# Patient Record
Sex: Female | Born: 1995 | Race: Black or African American | Hispanic: No | Marital: Single | Smoking: Current some day smoker
Health system: Southern US, Community
[De-identification: ages and names within clinical notes are randomized; demographics above are authoritative.]

## PROBLEM LIST (undated history)

## (undated) ENCOUNTER — Ambulatory Visit (HOSPITAL_COMMUNITY): Admission: EM | Payer: BLUE CROSS/BLUE SHIELD | Source: Home / Self Care

## (undated) DIAGNOSIS — N83209 Unspecified ovarian cyst, unspecified side: Secondary | ICD-10-CM

## (undated) DIAGNOSIS — R1013 Epigastric pain: Secondary | ICD-10-CM

---
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
## 2015-04-25 HISTORY — PX: UPPER GI ENDOSCOPY: SHX6162

## 2015-05-18 ENCOUNTER — Encounter (HOSPITAL_COMMUNITY): Payer: Self-pay | Admitting: Emergency Medicine

## 2015-05-18 ENCOUNTER — Emergency Department (HOSPITAL_COMMUNITY)
Admission: EM | Admit: 2015-05-18 | Discharge: 2015-05-18 | Discharge status: Against Medical Advice | Payer: BLUE CROSS/BLUE SHIELD | Attending: Emergency Medicine | Admitting: Emergency Medicine

## 2015-05-18 DIAGNOSIS — F17200 Nicotine dependence, unspecified, uncomplicated: Secondary | ICD-10-CM | POA: Diagnosis not present

## 2015-05-18 DIAGNOSIS — R197 Diarrhea, unspecified: Secondary | ICD-10-CM | POA: Insufficient documentation

## 2015-05-18 DIAGNOSIS — R109 Unspecified abdominal pain: Secondary | ICD-10-CM | POA: Diagnosis not present

## 2015-05-18 DIAGNOSIS — R112 Nausea with vomiting, unspecified: Secondary | ICD-10-CM | POA: Insufficient documentation

## 2015-05-18 DIAGNOSIS — Z3202 Encounter for pregnancy test, result negative: Secondary | ICD-10-CM | POA: Diagnosis not present

## 2015-05-18 LAB — URINALYSIS, ROUTINE W REFLEX MICROSCOPIC
BILIRUBIN URINE: NEGATIVE
GLUCOSE, UA: NEGATIVE mg/dL
KETONES UR: NEGATIVE mg/dL
Nitrite: POSITIVE — AB
PROTEIN: 100 mg/dL — AB
Specific Gravity, Urine: 1.014 (ref 1.005–1.030)
pH: 6.5 (ref 5.0–8.0)

## 2015-05-18 LAB — URINE MICROSCOPIC-ADD ON

## 2015-05-18 LAB — COMPREHENSIVE METABOLIC PANEL
ALBUMIN: 4.2 g/dL (ref 3.5–5.0)
ALT: 13 U/L — AB (ref 14–54)
ANION GAP: 12 (ref 5–15)
AST: 19 U/L (ref 15–41)
Alkaline Phosphatase: 66 U/L (ref 38–126)
BUN: 8 mg/dL (ref 6–20)
CALCIUM: 9.8 mg/dL (ref 8.9–10.3)
CHLORIDE: 104 mmol/L (ref 101–111)
CO2: 24 mmol/L (ref 22–32)
Creatinine, Ser: 0.8 mg/dL (ref 0.44–1.00)
GFR calc non Af Amer: >60 mL/min (ref >60)
GLUCOSE: 109 mg/dL — AB (ref 65–99)
POTASSIUM: 3.7 mmol/L (ref 3.5–5.1)
SODIUM: 140 mmol/L (ref 135–145)
Total Bilirubin: 0.8 mg/dL (ref 0.3–1.2)
Total Protein: 7.6 g/dL (ref 6.5–8.1)

## 2015-05-18 LAB — CBC
HCT: 31.6 % — ABNORMAL LOW (ref 36.0–46.0)
HEMOGLOBIN: 10.6 g/dL — AB (ref 12.0–15.0)
MCH: 26.7 pg (ref 26.0–34.0)
MCHC: 33.5 g/dL (ref 30.0–36.0)
MCV: 79.6 fL (ref 78.0–100.0)
Platelets: 298 10*3/uL (ref 150–400)
RBC: 3.97 MIL/uL (ref 3.87–5.11)
RDW: 16.6 % — ABNORMAL HIGH (ref 11.5–15.5)
WBC: 8.5 10*3/uL (ref 4.0–10.5)

## 2015-05-18 LAB — LIPASE, BLOOD: Lipase: 34 U/L (ref 11–51)

## 2015-05-18 LAB — POC URINE PREG, ED: Preg Test, Ur: NEGATIVE

## 2015-05-18 NOTE — ED Notes (Signed)
Pt.left without being seen by the doctor.pt.she has being waiting to long .stated she will return later.

## 2015-05-18 NOTE — ED Notes (Signed)
Pt. reports pain across her abdomen with nausea , vomitting and diarrhea onset this evening , denies fever or chills.

## 2015-05-19 ENCOUNTER — Encounter (HOSPITAL_COMMUNITY): Payer: Self-pay

## 2015-05-19 ENCOUNTER — Emergency Department (HOSPITAL_COMMUNITY)
Admission: EM | Admit: 2015-05-19 | Discharge: 2015-05-19 | Disposition: A | Discharge status: Home / Self Care | Payer: BLUE CROSS/BLUE SHIELD | Attending: Emergency Medicine | Admitting: Emergency Medicine

## 2015-05-19 DIAGNOSIS — F17200 Nicotine dependence, unspecified, uncomplicated: Secondary | ICD-10-CM | POA: Diagnosis not present

## 2015-05-19 DIAGNOSIS — R1032 Left lower quadrant pain: Secondary | ICD-10-CM | POA: Diagnosis present

## 2015-05-19 DIAGNOSIS — Z3202 Encounter for pregnancy test, result negative: Secondary | ICD-10-CM | POA: Diagnosis not present

## 2015-05-19 DIAGNOSIS — N390 Urinary tract infection, site not specified: Secondary | ICD-10-CM | POA: Diagnosis not present

## 2015-05-19 LAB — I-STAT BETA HCG BLOOD, ED (MC, WL, AP ONLY): I-stat hCG, quantitative: <5 m[IU]/mL (ref <5)

## 2015-05-19 LAB — CBC
HEMATOCRIT: 35.1 % — AB (ref 36.0–46.0)
Hemoglobin: 11.6 g/dL — ABNORMAL LOW (ref 12.0–15.0)
MCH: 26.8 pg (ref 26.0–34.0)
MCHC: 33 g/dL (ref 30.0–36.0)
MCV: 81.1 fL (ref 78.0–100.0)
PLATELETS: 356 10*3/uL (ref 150–400)
RBC: 4.33 MIL/uL (ref 3.87–5.11)
RDW: 16.5 % — AB (ref 11.5–15.5)
WBC: 9.3 10*3/uL (ref 4.0–10.5)

## 2015-05-19 LAB — COMPREHENSIVE METABOLIC PANEL
ALBUMIN: 5 g/dL (ref 3.5–5.0)
ALT: 14 U/L (ref 14–54)
AST: 20 U/L (ref 15–41)
Alkaline Phosphatase: 69 U/L (ref 38–126)
Anion gap: 11 (ref 5–15)
BILIRUBIN TOTAL: 0.8 mg/dL (ref 0.3–1.2)
BUN: 12 mg/dL (ref 6–20)
CHLORIDE: 104 mmol/L (ref 101–111)
CO2: 25 mmol/L (ref 22–32)
CREATININE: 0.81 mg/dL (ref 0.44–1.00)
Calcium: 10 mg/dL (ref 8.9–10.3)
GFR calc Af Amer: >60 mL/min (ref >60)
GLUCOSE: 116 mg/dL — AB (ref 65–99)
POTASSIUM: 3.4 mmol/L — AB (ref 3.5–5.1)
Sodium: 140 mmol/L (ref 135–145)
Total Protein: 8.9 g/dL — ABNORMAL HIGH (ref 6.5–8.1)

## 2015-05-19 LAB — LIPASE, BLOOD: LIPASE: 37 U/L (ref 11–51)

## 2015-05-19 MED ORDER — CEPHALEXIN 500 MG PO CAPS: 500.0000 mg | ORAL_CAPSULE | Freq: Four times a day (QID) | ORAL | Status: DC

## 2015-05-19 MED ORDER — ONDANSETRON 4 MG PO TBDP
4.0000 mg | ORAL_TABLET | Freq: Once | ORAL | Status: AC
  Administered 2015-05-19: 4 mg via ORAL
  Filled 2015-05-19: qty 1

## 2015-05-19 MED ORDER — HYDROCODONE-ACETAMINOPHEN 5-325 MG PO TABS: 1.0000 | ORAL_TABLET | Freq: Four times a day (QID) | ORAL | Status: DC | PRN

## 2015-05-19 MED ORDER — KETOROLAC TROMETHAMINE 30 MG/ML IJ SOLN
30.0000 mg | Freq: Once | INTRAMUSCULAR | Status: AC
  Administered 2015-05-19: 30 mg via INTRAMUSCULAR
  Filled 2015-05-19: qty 1

## 2015-05-19 NOTE — ED Notes (Signed)
She c/o low abd. Pain x 2-3 days.  She states she was seen at [facility] today and was dx with uti and given an "antibiotic shot".  She states she is here d/t "I'm just hurting too bad" [sic].  She is in no distress.

## 2015-05-19 NOTE — Discharge Instructions (Signed)
Please read and follow all provided instructions.  Your diagnoses today include:  1. UTI (lower urinary tract infection)    Tests performed today include:  Urine test - suggests that you have an infection in your bladder  Vital signs. See below for your results today.   Medications prescribed:   Keflex- Take as Prescribed   Norco- Take as Prescriebd  Home care instructions:  Follow any educational materials contained in this packet.  Follow-up instructions: Please follow-up with your primary care provider in 48-72 hours if symptoms are not resolved for further evaluation of your symptoms.  Return instructions:   Please return to the Emergency Department if you experience worsening symptoms.   Return with fever, worsening pain, persistent vomiting, worsening pain in your back.   Please return if you have any other emergent concerns.  Additional Information:  Your vital signs today were: BP 120/82 mmHg   Pulse 68   Temp(Src) 98.2 F (36.8 C) (Oral)   Resp 16   SpO2 98%   LMP 04/02/2015 If your blood pressure (BP) was elevated above 135/85 this visit, please have this repeated by your doctor within one month. --------------

## 2015-05-19 NOTE — ED Provider Notes (Signed)
CSN: [id number]     Arrival date & time 05/19/15  1251 History   First MD Initiated Contact with Patient 05/19/15 1530     Chief Complaint  Patient presents with  . Abdominal Pain   (Consider location/radiation/quality/duration/timing/severity/associated sxs/prior Treatment) HPI 20 y.o. female presents to the Emergency Department today complaining of lower abdominal pain for the past 2 days. She states that the pain is a 10/10 in the LLQ and RLQ. Sharp and crampy sensation. Associated N/V with no diarrhea. No fevers. Reports decrease PO intake. [facility]. They did a urine and pelvic exam and found that she had a UTI. She was given a shot of antibiotics and discharged home. Pt still continuing to have symptoms. Does not endorse any dysuria, btu does endorse vaginal discharge. No CP/SOB. No headache. No other symptoms noted. No bleeding. LMP was Dec 12th.    History reviewed. No pertinent past medical history. No past surgical history on file. No family history on file. Social History  Substance Use Topics  . Smoking status: Current Some Day Smoker  . Smokeless tobacco: None  . Alcohol Use: Yes   OB History    No data available     Review of Systems ROS reviewed and all are negative for acute change except as noted in the HPI.  Allergies  Review of patient's allergies indicates no known allergies.  Home Medications   Prior to Admission medications   Medication Sig Start Date End Date Taking? Authorizing Provider  naproxen sodium (ANAPROX) 220 MG tablet Take 440 mg by mouth daily as needed (pain).   Yes Historical Provider, MD   BP 120/82 mmHg  Pulse 68  Temp(Src) 98.2 F (36.8 C) (Oral)  Resp 16  SpO2 98%  LMP 04/02/2015   Physical Exam  Constitutional: She is oriented to person, place, and time. She appears well-developed and well-nourished.  HENT:  Head: Normocephalic and atraumatic.  Eyes: EOM are normal.   Neck: Normal range of motion. Neck supple.  Cardiovascular: Normal rate, regular rhythm and normal heart sounds.   Pulmonary/Chest: Effort normal and breath sounds normal.  Abdominal: Soft. Normal appearance and bowel sounds are normal. She exhibits no distension and no mass. There is tenderness in the right lower quadrant and left lower quadrant. There is CVA tenderness. There is no rigidity, no guarding, no tenderness at McBurney's point and negative Murphy's sign.  Musculoskeletal: Normal range of motion.  Neurological: She is alert and oriented to person, place, and time.  Skin: Skin is warm and dry.  Psychiatric: She has a normal mood and affect. Her behavior is normal. Thought content normal.  Nursing note and vitals reviewed.  ED Course  Procedures (including critical care time) Labs Review Labs Reviewed  COMPREHENSIVE METABOLIC PANEL - Abnormal; Notable for the following:    Potassium 3.4 (*)    Glucose, Bld 116 (*)    Total Protein 8.9 (*)    All other components within normal limits  CBC - Abnormal; Notable for the following:    Hemoglobin 11.6 (*)    HCT 35.1 (*)    RDW 16.5 (*)    All other components within normal limits  LIPASE, BLOOD  I-STAT BETA HCG BLOOD, ED ([facility], [facility], [facility] ONLY)   Imaging Review No results found. I have personally reviewed and evaluated these images and lab results as part of my medical decision-making.   EKG Interpretation None  MDM  I have reviewed relevant laboratory values. I have reviewed the relevant previous healthcare records. I obtained HPI from historian. Patient discussed with supervising physician  ED Course:  Assessment: 19y female presents with lower abdominal pain for the past 2 days. Seen by [facility] today, but came to ED for pain control. I spoke with the Physician who took care of the patient today at the [facility]. States that the Urine came back with Pos Bac/Leuk/Nitrites. Treated with 1g Rocephin IM. GC  collected and waiting results. Pelvic exam was unremarkable on his exam. Impression was that it may be the onset of menses that the patient endorses happens. Low suspicion for appendicitis and ovarian torsion based on exam and hx. Given Rx of Keflex as well as analgesia. Will have her follow up with infirmary for results of previous visit and further treatment.  Patient is in no acute distress. Vital Signs are stable. Patient is able to ambulate. Patient able to tolerate PO.      Disposition/Plan:  DC Home Additional Verbal discharge instructions given and discussed with patient.  Pt Instructed to f/u with PCP in the next 48 hours for evaluation and treatment of symptoms. Return precautions given Pt acknowledges and agrees with plan   Supervising Physician Vanetta Mulders, MD   Final diagnoses:  UTI (lower urinary tract infection)       Audry Pili, PA-C 05/19/15 1736  Vanetta Mulders, MD 05/22/15 [phone]

## 2015-07-18 ENCOUNTER — Emergency Department: Admit: 2015-07-19 | Payer: BLUE CROSS/BLUE SHIELD | Primary: Pediatrics

## 2015-07-18 DIAGNOSIS — N83209 Unspecified ovarian cyst, unspecified side: Secondary | ICD-10-CM

## 2015-07-18 NOTE — ED Notes (Signed)
Pt states she is sexually active, and does not always use protection. States she is "not sure" if she has vaginal discharge.

## 2015-07-18 NOTE — ED Notes (Signed)
Assisted with pelvic done by NP. Pt tolerated well.

## 2015-07-18 NOTE — ED Triage Notes (Signed)
"  I'm having pains in my lower stomach since yesterday."  Intermittent pain, pt. Report vomiting x 3 today, denies diarrhea/urinary symptoms.  Ibuprofen 400 mg @ 1200

## 2015-07-18 NOTE — ED Provider Notes (Signed)
HPI Comments: Leah Sanders is a 20 y.o. female  who presents ambulatory with her mother to [facility] peds ED with cc of pelvic pain. Mother states pt had worsening pelvic pain x 2 days. She states pt called her from college (in [facility]) several times c/o worsening pain and crying. No hx of abdominal trauma or falls. Pt also had nausea and vomiting today. No diarrhea. Last normal BM was yesterday.  Mother states similar episode of pelvic pain in January of this year. Saw PCP and [facility] NP and advised no acute findings. Pt states she was evaluated in ED in [place] at that time and was given "a shot and placed on some antibiotics" although pt is not sure why these were administered. Pt states she is not on birth control and had unprotected sex. She denies any atypical vaginal bleeding or discharge. Does no report urinary symptoms. Has taken Motrin WNR of pain and nothing makes pain better or worse at this time. Denies any fevers, reports some chills. No recent sore throat, cough, congestion, rhinorrhea. No abnormal WWE in the past.     PCP: Salli Real, MD    There are no other complaints, changes or physical findings at this time.      The history is provided by the patient and a parent.        History reviewed. No pertinent past medical history.    History reviewed. No pertinent surgical history.      History reviewed. No pertinent family history.    Social History     Social History   ??? Marital status: SINGLE     Spouse name: N/A   ??? Number of children: N/A   ??? Years of education: N/A     Occupational History   ??? Not on file.     Social History Main Topics   ??? Smoking status: Not on file   ??? Smokeless tobacco: Not on file   ??? Alcohol use Not on file   ??? Drug use: Not on file   ??? Sexual activity: Not on file     Other Topics Concern   ??? Not on file     Social History Narrative   ??? No narrative on file         ALLERGIES: Review of patient's allergies indicates no known allergies.    Review of Systems    Constitutional: Negative for activity change, appetite change, chills and fever.   HENT: Negative for congestion, rhinorrhea, sinus pressure, sneezing and sore throat.    Eyes: Negative for pain, discharge and visual disturbance.   Respiratory: Negative for cough and shortness of breath.    Cardiovascular: Negative for chest pain.   Gastrointestinal: Positive for nausea and vomiting. Negative for abdominal pain and diarrhea.   Genitourinary: Positive for pelvic pain. Negative for dysuria, flank pain, frequency and urgency.   Musculoskeletal: Negative for arthralgias, back pain, gait problem, joint swelling, myalgias and neck pain.   Skin: Negative for color change and rash.   Neurological: Negative for dizziness, speech difficulty, weakness, light-headedness, numbness and headaches.   Psychiatric/Behavioral: Negative for agitation, behavioral problems and confusion.   All other systems reviewed and are negative.      Vitals:    07/18/15 2021 07/18/15 2240   BP: 115/61    Pulse: 77 68   Resp: 18 16   Temp: 98.2 ??F (36.8 ??C) 98.6 ??F (37 ??C)   SpO2: 99% 100%   Weight: 63.5 kg (139 lb 15.9  oz)             Physical Exam   Constitutional: She is oriented to person, place, and time. She appears well-developed and well-nourished. She appears distressed (pain distress).   HENT:   Head: Normocephalic and atraumatic.   Right Ear: External ear normal.   Left Ear: External ear normal.   Nose: Nose normal.   Mouth/Throat: Oropharynx is clear and moist. No oropharyngeal exudate.   Eyes: Conjunctivae and EOM are normal. Pupils are equal, round, and reactive to light.   Neck: Normal range of motion. Neck supple.   Cardiovascular: Normal rate, regular rhythm, normal heart sounds and intact distal pulses.    Pulmonary/Chest: Effort normal and breath sounds normal.   Abdominal: Soft. Bowel sounds are normal. She exhibits no distension. There is no tenderness. There is no rebound and no guarding.   Genitourinary:    Genitourinary Comments: GU EXAM:  External genitalia normal.  Pelvic exam: cervix normal, ovaries and uterus normal size and non-tender to palpation, no cervical motion tenderness or adnexal masses. (+) thin/white/nonodorous discharge. No bleeding or foreign body.      Musculoskeletal: Normal range of motion.   Neurological: She is alert and oriented to person, place, and time.   Skin: Skin is warm and dry.   Psychiatric: She has a normal mood and affect. Her behavior is normal. Judgment and thought content normal.   Nursing note and vitals reviewed.       MDM  Number of Diagnoses or Management Options  BV (bacterial vaginosis):   Cyst of ovary, unspecified laterality:   Nausea and vomiting, intractability of vomiting not specified, unspecified vomiting type:   Diagnosis management comments: DDx; STI, BV, ovarian cyst, UTI, pregnancy     20 yo F presents 2/2 c/o pelvic pain worsening over the weekend. R ovarian cyst noted on [place]. (+) BV on pelvic findings. Non-toxic appearance with stable VS. Advised for f/u with OBGYN. Also advised on safe sex/ BC planning. Educated on pain management for home.  N/V could be viral as well as response to pain. TOlerated PO p/t d/c.        Amount and/or Complexity of Data Reviewed  Clinical lab tests: ordered and reviewed  Tests in the radiology section of CPT??: ordered and reviewed  Review and summarize past medical records: yes  Discuss the patient with other providers: yes      ED Course       Procedures    Procedure Note - Pelvic Exam:    10:30 PM  Performed by: Donzetta Kohut, NP  Chaperoned by: Jenny Reichmann RN   Pelvic exam was performed using bimanual and speculum. Further findings noted in physical exam.   The procedure took 1-15 minutes, and pt tolerated well.      LABORATORY TESTS:  Recent Results (from the past 12 hour(s))   URINALYSIS W/ RFLX MICROSCOPIC    Collection Time: 07/18/15  8:25 PM   Result Value Ref Range    Color YELLOW/STRAW      Appearance CLEAR CLEAR       Specific gravity 1.020 1.003 - 1.030      pH (UA) 6.0 5.0 - 8.0      Protein TRACE (A) NEG mg/dL    Glucose NEGATIVE  NEG mg/dL    Ketone >80 (A) NEG mg/dL    Blood TRACE (A) NEG      Urobilinogen 0.2 0.2 - 1.0 EU/dL    Nitrites NEGATIVE  NEG  Leukocyte Esterase NEGATIVE  NEG      WBC 0-4 0 - 4 /hpf    RBC 0-5 0 - 5 /hpf    Epithelial cells FEW FEW /lpf    Bacteria 1+ (A) NEG /hpf    Amorphous Crystals FEW (A) NEG     BILIRUBIN, CONFIRM    Collection Time: 07/18/15  8:25 PM   Result Value Ref Range    Bilirubin UA, confirm NEGATIVE  NEG     CBC WITH AUTOMATED DIFF    Collection Time: 07/18/15  8:25 PM   Result Value Ref Range    WBC 9.3 3.6 - 11.0 K/uL    RBC 4.71 3.80 - 5.20 M/uL    HGB 12.7 11.5 - 16.0 g/dL    HCT 37.5 35.0 - 47.0 %    MCV 79.6 (L) 80.0 - 99.0 FL    MCH 27.0 26.0 - 34.0 PG    MCHC 33.9 30.0 - 36.5 g/dL    RDW 15.3 (H) 11.5 - 14.5 %    PLATELET 304 150 - 400 K/uL    NEUTROPHILS 82 (H) 32 - 75 %    LYMPHOCYTES 12 12 - 49 %    MONOCYTES 6 5 - 13 %    EOSINOPHILS 0 0 - 7 %    BASOPHILS 0 0 - 1 %    ABS. NEUTROPHILS 7.6 1.8 - 8.0 K/UL    ABS. LYMPHOCYTES 1.1 0.8 - 3.5 K/UL    ABS. MONOCYTES 0.6 0.0 - 1.0 K/UL    ABS. EOSINOPHILS 0.0 0.0 - 0.4 K/UL    ABS. BASOPHILS 0.0 0.0 - 0.1 K/UL   METABOLIC PANEL, COMPREHENSIVE    Collection Time: 07/18/15  8:25 PM   Result Value Ref Range    Sodium 136 136 - 145 mmol/L    Potassium 3.9 3.5 - 5.1 mmol/L    Chloride 102 97 - 108 mmol/L    CO2 20 (L) 21 - 32 mmol/L    Anion gap 14 5 - 15 mmol/L    Glucose 81 65 - 100 mg/dL    BUN 12 6 - 20 MG/DL    Creatinine 0.99 0.55 - 1.02 MG/DL    BUN/Creatinine ratio 12 12 - 20      GFR est AA >60 >60 ml/min/1.87m    GFR est non-AA >60 >60 ml/min/1.735m   Calcium 10.6 (H) 8.5 - 10.1 MG/DL    Bilirubin, total 1.6 (H) 0.2 - 1.0 MG/DL    ALT (SGPT) 22 12 - 78 U/L    AST (SGOT) 19 15 - 37 U/L    Alk. phosphatase 83 45 - 117 U/L    Protein, total 9.6 (H) 6.4 - 8.2 g/dL    Albumin 5.5 (H) 3.5 - 5.0 g/dL     Globulin 4.1 (H) 2.0 - 4.0 g/dL    A-G Ratio 1.3 1.1 - 2.2     HCG URINE, QL. - POC    Collection Time: 07/18/15  8:27 PM   Result Value Ref Range    Pregnancy test,urine (POC) NEGATIVE  NEG     KOH, OTHER SOURCES    Collection Time: 07/18/15 10:30 PM   Result Value Ref Range    Special Requests: NO SPECIAL REQUESTS      KOH NO YEAST SEEN     WET PREP    Collection Time: 07/18/15 10:30 PM   Result Value Ref Range    Clue cells CLUE CELLS PRESENT  Wet prep NO TRICHOMONAS SEEN         IMAGING RESULTS:  [place] PELV NON OBS   Final Result      US TRANSVAGINAL   Final Result      EXAM: US pelvis non-OB; US transvaginal non-OB  ??  INDICATION: Lower abdominal pain. Vomiting for 3 days.  ??  COMPARISON: None.  ??  TECHNIQUE: Transabdominal and transvaginal ultrasound of the female pelvis.  ??  FINDINGS: Transabdominal ultrasound:  Urinary bladder: within normal limits.  ??  The uterus and ovaries are not visualized due to bowel gas.  ??  Free fluid: None.  ??  Endovaginal ultrasound:   Uterus: measures 6.1 x 2.9 x 2.3 cm, which is within normal limits. There is no  sonographic myometrial abnormality.  ??  Endometrium: 11 mm in thickness. Homogeneous.  ??  Right ovary: 4.7 x 4.7 x 3.9 cm. Blood flow is present in the right ovary.  Hemorrhagic cyst measures 3.9 x 3.4 x 2.9 cm.  ??  Left ovary: 2.9 x 2.2 x 3.5 cm. Blood flow is present in the left ovary. No  adnexal cyst or mass.  ??  Free fluid: None.  ??  IMPRESSION  IMPRESSION:   A right adnexal hemorrhagic cyst measures 3.9 x 3.4 x 2.9 cm. Normal appearance  of the uterus and left ovary.    MEDICATIONS GIVEN:  Medications   ketorolac (TORADOL) injection 30 mg (30 mg IntraVENous Given 07/18/15 2100)   ondansetron (ZOFRAN) injection 4 mg (4 mg IntraVENous Given 07/18/15 2100)   sodium chloride 0.9 % bolus infusion 1,000 mL (0 mL IntraVENous IV Completed 07/18/15 2212)   HYDROcodone-acetaminophen (NORCO) 5-325 mg per tablet 1 Tab (1 Tab Oral Given 07/18/15 2237)    ibuprofen (MOTRIN) tablet 800 mg (800 mg Oral Given 07/18/15 2237)   metroNIDAZOLE (FLAGYL) tablet 500 mg (500 mg Oral Given 07/18/15 2319)   ondansetron (ZOFRAN ODT) tablet 4 mg (4 mg Oral Given 07/18/15 2319)       IMPRESSION:  1. Nausea and vomiting, intractability of vomiting not specified, unspecified vomiting type    2. Cyst of ovary, unspecified laterality    3. BV (bacterial vaginosis)        PLAN:  1.   Discharge Medication List as of 07/18/2015 11:15 PM      START taking these medications    Details   ondansetron (ZOFRAN ODT) 4 mg disintegrating tablet Take 1 Tab by mouth every eight (8) hours as needed for Nausea., Print, Disp-10 Tab, R-0      metroNIDAZOLE (FLAGYL) 500 mg tablet Take 1 Tab by mouth two (2) times a day for 7 days., Print, Disp-14 Tab, R-0      ibuprofen (MOTRIN) 800 mg tablet Take 1 Tab by mouth every six (6) hours as needed for Pain for up to 7 days., Print, Disp-20 Tab, R-0      HYDROcodone-acetaminophen (NORCO) 5-325 mg per tablet Take 1 Tab by mouth every eight (8) hours as needed for Pain. Max Daily Amount: 3 Tabs., Print, Disp-8 Tab, R-0           2.   Follow-up Information     Follow up With Details Comments [place], NP Schedule an appointment as soon as possible for a visit  [place] [id number]  [phone]      [facility] EMR DEPT Go to As  needed, If symptoms worsen [place]  [phone]        3. Return to ED if worse       Discharge Note:    The patient is ready for discharge. The patient's signs, symptoms, diagnosis, and discharge instruction have been discussed and the patient has conveyed their understanding. The patient is to follow up as recommended or return to the ER should their symptoms worsen. Plan has been discussed and the patient is in agreement.    Donzetta Kohut, NP

## 2015-07-18 NOTE — ED Notes (Signed)
Pt to US.

## 2015-07-18 NOTE — ED Notes (Signed)
Pt discharged home. Pt acting age appropriately, respirations regular and unlabored, cap refill less than two seconds. Skin pink, dry and warm. Lungs clear bilaterally. No further complaints at this time. Pt verbalized understanding of discharge paperwork and has no further questions at this time.    Education provided about continuation of care, follow up care and medication administration. Pt able to provided teach back about discharge instructions.

## 2015-07-19 ENCOUNTER — Inpatient Hospital Stay
Admit: 2015-07-19 | Discharge: 2015-07-19 | Disposition: A | Discharge status: Home / Self Care | Payer: BLUE CROSS/BLUE SHIELD | Attending: Emergency Medicine

## 2015-07-19 LAB — URINALYSIS W/ RFLX MICROSCOPIC
Glucose: NEGATIVE mg/dL
Ketone: >80 mg/dL — AB
Leukocyte Esterase: NEGATIVE
Nitrites: NEGATIVE
Specific gravity: 1.02 (ref 1.003–1.030)
Urobilinogen: 0.2 EU/dL (ref 0.2–1.0)
pH (UA): 6 (ref 5.0–8.0)

## 2015-07-19 LAB — METABOLIC PANEL, COMPREHENSIVE
A-G Ratio: 1.3 (ref 1.1–2.2)
ALT (SGPT): 22 U/L (ref 12–78)
AST (SGOT): 19 U/L (ref 15–37)
Albumin: 5.5 g/dL — ABNORMAL HIGH (ref 3.5–5.0)
Alk. phosphatase: 83 U/L (ref 45–117)
Anion gap: 14 mmol/L (ref 5–15)
BUN/Creatinine ratio: 12 (ref 12–20)
BUN: 12 MG/DL (ref 6–20)
Bilirubin, total: 1.6 MG/DL — ABNORMAL HIGH (ref 0.2–1.0)
CO2: 20 mmol/L — ABNORMAL LOW (ref 21–32)
Calcium: 10.6 MG/DL — ABNORMAL HIGH (ref 8.5–10.1)
Chloride: 102 mmol/L (ref 97–108)
Creatinine: 0.99 MG/DL (ref 0.55–1.02)
GFR est AA: >60 mL/min/{1.73_m2} (ref >60)
GFR est non-AA: >60 mL/min/{1.73_m2} (ref >60)
Globulin: 4.1 g/dL — ABNORMAL HIGH (ref 2.0–4.0)
Glucose: 81 mg/dL (ref 65–100)
Potassium: 3.9 mmol/L (ref 3.5–5.1)
Protein, total: 9.6 g/dL — ABNORMAL HIGH (ref 6.4–8.2)
Sodium: 136 mmol/L (ref 136–145)

## 2015-07-19 LAB — HCG URINE, QL. - POC: Pregnancy test,urine (POC): NEGATIVE

## 2015-07-19 LAB — CBC WITH AUTOMATED DIFF
ABS. BASOPHILS: 0 10*3/uL (ref 0.0–0.1)
ABS. EOSINOPHILS: 0 10*3/uL (ref 0.0–0.4)
ABS. LYMPHOCYTES: 1.1 10*3/uL (ref 0.8–3.5)
ABS. MONOCYTES: 0.6 10*3/uL (ref 0.0–1.0)
ABS. NEUTROPHILS: 7.6 10*3/uL (ref 1.8–8.0)
BASOPHILS: 0 % (ref 0–1)
EOSINOPHILS: 0 % (ref 0–7)
HCT: 37.5 % (ref 35.0–47.0)
HGB: 12.7 g/dL (ref 11.5–16.0)
LYMPHOCYTES: 12 % (ref 12–49)
MCH: 27 PG (ref 26.0–34.0)
MCHC: 33.9 g/dL (ref 30.0–36.5)
MCV: 79.6 FL — ABNORMAL LOW (ref 80.0–99.0)
MONOCYTES: 6 % (ref 5–13)
NEUTROPHILS: 82 % — ABNORMAL HIGH (ref 32–75)
PLATELET: 304 10*3/uL (ref 150–400)
RBC: 4.71 M/uL (ref 3.80–5.20)
RDW: 15.3 % — ABNORMAL HIGH (ref 11.5–14.5)
WBC: 9.3 10*3/uL (ref 3.6–11.0)

## 2015-07-19 LAB — KOH, OTHER SOURCES: KOH: NONE SEEN

## 2015-07-19 LAB — BILIRUBIN, CONFIRM: Bilirubin UA, confirm: NEGATIVE

## 2015-07-19 LAB — WET PREP: Wet prep: NONE SEEN

## 2015-07-19 MED ORDER — ONDANSETRON 4 MG TAB, RAPID DISSOLVE
4 mg | ORAL | Status: AC
Start: 2015-07-19 — End: 2015-07-18
  Administered 2015-07-19: 03:00:00 via ORAL

## 2015-07-19 MED ORDER — IBUPROFEN 800 MG TAB
800 mg | ORAL_TABLET | Freq: Four times a day (QID) | ORAL | 0 refills | Status: AC | PRN
Start: 2015-07-19 — End: 2015-07-25

## 2015-07-19 MED ORDER — KETOROLAC TROMETHAMINE 30 MG/ML INJECTION
30 mg/mL (1 mL) | INTRAMUSCULAR | Status: AC
Start: 2015-07-19 — End: 2015-07-18
  Administered 2015-07-19: 01:00:00 via INTRAVENOUS

## 2015-07-19 MED ORDER — IBUPROFEN 400 MG TAB
400 mg | ORAL | Status: AC
Start: 2015-07-19 — End: 2015-07-18
  Administered 2015-07-19: 03:00:00 via ORAL

## 2015-07-19 MED ORDER — METRONIDAZOLE 250 MG TAB
250 mg | ORAL | Status: AC
Start: 2015-07-19 — End: 2015-07-18
  Administered 2015-07-19: 03:00:00 via ORAL

## 2015-07-19 MED ORDER — ONDANSETRON (PF) 4 MG/2 ML INJECTION
4 mg/2 mL | INTRAMUSCULAR | Status: AC
Start: 2015-07-19 — End: 2015-07-18
  Administered 2015-07-19: 01:00:00 via INTRAVENOUS

## 2015-07-19 MED ORDER — METRONIDAZOLE 500 MG TAB
500 mg | ORAL_TABLET | Freq: Two times a day (BID) | ORAL | 0 refills | Status: AC
Start: 2015-07-19 — End: 2015-07-25

## 2015-07-19 MED ORDER — SODIUM CHLORIDE 0.9% BOLUS IV
0.9 % | Freq: Once | INTRAVENOUS | Status: AC
Start: 2015-07-19 — End: 2015-07-18
  Administered 2015-07-19: 01:00:00 via INTRAVENOUS

## 2015-07-19 MED ORDER — HYDROCODONE-ACETAMINOPHEN 5 MG-325 MG TAB
5-325 mg | ORAL | Status: AC
Start: 2015-07-19 — End: 2015-07-18
  Administered 2015-07-19: 03:00:00 via ORAL

## 2015-07-19 MED ORDER — ONDANSETRON 4 MG TAB, RAPID DISSOLVE
4 mg | ORAL_TABLET | Freq: Three times a day (TID) | ORAL | 0 refills | Status: DC | PRN
Start: 2015-07-19 — End: 2018-05-02

## 2015-07-19 MED ORDER — HYDROCODONE-ACETAMINOPHEN 5 MG-325 MG TAB
5-325 mg | ORAL_TABLET | Freq: Three times a day (TID) | ORAL | 0 refills | Status: DC | PRN
Start: 2015-07-19 — End: 2015-07-22

## 2015-07-19 MED FILL — IBUPROFEN 400 MG TAB: 400 mg | ORAL | Qty: 2

## 2015-07-19 MED FILL — ONDANSETRON (PF) 4 MG/2 ML INJECTION: 4 mg/2 mL | INTRAMUSCULAR | Qty: 2

## 2015-07-19 MED FILL — HYDROCODONE-ACETAMINOPHEN 5 MG-325 MG TAB: 5-325 mg | ORAL | Qty: 1

## 2015-07-19 MED FILL — SODIUM CHLORIDE 0.9 % IV: INTRAVENOUS | Qty: 1000

## 2015-07-19 MED FILL — METRONIDAZOLE 250 MG TAB: 250 mg | ORAL | Qty: 2

## 2015-07-19 MED FILL — KETOROLAC TROMETHAMINE 30 MG/ML INJECTION: 30 mg/mL (1 mL) | INTRAMUSCULAR | Qty: 1

## 2015-07-20 LAB — CHLAMYDIA/GC PCR
Chlamydia amplified: NEGATIVE
N. gonorrhea, amplified: NEGATIVE

## 2015-07-22 ENCOUNTER — Emergency Department: Admit: 2015-07-22 | Payer: BLUE CROSS/BLUE SHIELD | Primary: Pediatrics

## 2015-07-22 ENCOUNTER — Inpatient Hospital Stay
Admit: 2015-07-22 | Discharge: 2015-07-23 | Disposition: A | Discharge status: Home / Self Care | Payer: BLUE CROSS/BLUE SHIELD | Attending: Student in an Organized Health Care Education/Training Program

## 2015-07-22 DIAGNOSIS — N83201 Unspecified ovarian cyst, right side: Secondary | ICD-10-CM

## 2015-07-22 LAB — METABOLIC PANEL, COMPREHENSIVE
A-G Ratio: 1.3 (ref 1.1–2.2)
ALT (SGPT): 23 U/L (ref 12–78)
AST (SGOT): 10 U/L — ABNORMAL LOW (ref 15–37)
Albumin: 4.6 g/dL (ref 3.5–5.0)
Alk. phosphatase: 71 U/L (ref 45–117)
Anion gap: 11 mmol/L (ref 5–15)
BUN/Creatinine ratio: 11 — ABNORMAL LOW (ref 12–20)
BUN: 8 MG/DL (ref 6–20)
Bilirubin, total: 0.8 MG/DL (ref 0.2–1.0)
CO2: 22 mmol/L (ref 21–32)
Calcium: 9.5 MG/DL (ref 8.5–10.1)
Chloride: 106 mmol/L (ref 97–108)
Creatinine: 0.75 MG/DL (ref 0.55–1.02)
GFR est AA: >60 mL/min/{1.73_m2} (ref >60)
GFR est non-AA: >60 mL/min/{1.73_m2} (ref >60)
Globulin: 3.5 g/dL (ref 2.0–4.0)
Glucose: 101 mg/dL — ABNORMAL HIGH (ref 65–100)
Potassium: 3.3 mmol/L — ABNORMAL LOW (ref 3.5–5.1)
Protein, total: 8.1 g/dL (ref 6.4–8.2)
Sodium: 139 mmol/L (ref 136–145)

## 2015-07-22 LAB — CBC WITH AUTOMATED DIFF
ABS. BASOPHILS: 0 10*3/uL (ref 0.0–0.1)
ABS. EOSINOPHILS: 0 10*3/uL (ref 0.0–0.4)
ABS. LYMPHOCYTES: 0.8 10*3/uL (ref 0.8–3.5)
ABS. MONOCYTES: 0.1 10*3/uL (ref 0.0–1.0)
ABS. NEUTROPHILS: 5.4 10*3/uL (ref 1.8–8.0)
BASOPHILS: 0 % (ref 0–1)
EOSINOPHILS: 0 % (ref 0–7)
HCT: 36 % (ref 35.0–47.0)
HGB: 12.2 g/dL (ref 11.5–16.0)
LYMPHOCYTES: 13 % (ref 12–49)
MCH: 27.1 PG (ref 26.0–34.0)
MCHC: 33.9 g/dL (ref 30.0–36.5)
MCV: 79.8 FL — ABNORMAL LOW (ref 80.0–99.0)
MONOCYTES: 2 % — ABNORMAL LOW (ref 5–13)
NEUTROPHILS: 85 % — ABNORMAL HIGH (ref 32–75)
PLATELET: 276 10*3/uL (ref 150–400)
RBC: 4.51 M/uL (ref 3.80–5.20)
RDW: 15.5 % — ABNORMAL HIGH (ref 11.5–14.5)
WBC: 6.4 10*3/uL (ref 3.6–11.0)

## 2015-07-22 MED ORDER — ONDANSETRON (PF) 4 MG/2 ML INJECTION
4 mg/2 mL | INTRAMUSCULAR | Status: AC
Start: 2015-07-22 — End: 2015-07-22
  Administered 2015-07-22: 22:00:00 via INTRAVENOUS

## 2015-07-22 MED ORDER — SODIUM CHLORIDE 0.9% BOLUS IV
0.9 % | Freq: Once | INTRAVENOUS | Status: DC
Start: 2015-07-22 — End: 2015-07-22

## 2015-07-22 MED ORDER — MORPHINE 2 MG/ML INJECTION
2 mg/mL | INTRAMUSCULAR | Status: AC
Start: 2015-07-22 — End: 2015-07-22
  Administered 2015-07-22: 22:00:00 via INTRAVENOUS

## 2015-07-22 MED ORDER — MORPHINE 2 MG/ML INJECTION
2 mg/mL | INTRAMUSCULAR | Status: AC
Start: 2015-07-22 — End: 2015-07-22
  Administered 2015-07-22: via INTRAVENOUS

## 2015-07-22 MED FILL — MORPHINE 2 MG/ML INJECTION: 2 mg/mL | INTRAMUSCULAR | Qty: 2

## 2015-07-22 MED FILL — MORPHINE 2 MG/ML INJECTION: 2 mg/mL | INTRAMUSCULAR | Qty: 1

## 2015-07-22 MED FILL — SODIUM CHLORIDE 0.9 % IV: INTRAVENOUS | Qty: 1000

## 2015-07-22 MED FILL — ONDANSETRON (PF) 4 MG/2 ML INJECTION: 4 mg/2 mL | INTRAMUSCULAR | Qty: 2

## 2015-07-22 NOTE — ED Provider Notes (Signed)
HPI Comments: 20 yo F with history of hemorrhagic ovarian cyst and weight loss presenting with acute on chronic symptoms.  Patient seen in this ED 4 days ago and was diagnosed with a hemorrhagic right ovarian cyst.  Was discharged on prn zofran and percocet.  Patient pain was improving and had follow-up with Dr. Bethann Goo of [facility] - will follow the cyst for now.  Today she underwent EGD with GI for weight loss and appetite changes (results unknown).  Since the procedure she has had increased RLQ pain that affects her entire abdomen.  She tried zofran and percocet but vomited all her medication.  CP only prior to episodes of vomiting.  Called OB who recommended CT.    Patient is a 20 y.o. female presenting with abdominal pain. The history is provided by the patient and a parent.   Abdominal Pain    Associated symptoms include nausea and vomiting. Pertinent negatives include no fever, no diarrhea, no constipation, no dysuria and no headaches.        History reviewed. No pertinent past medical history.    History reviewed. No pertinent surgical history.      History reviewed. No pertinent family history.    Social History     Social History   ??? Marital status: SINGLE     Spouse name: N/A   ??? Number of children: N/A   ??? Years of education: N/A     Occupational History   ??? Not on file.     Social History Main Topics   ??? Smoking status: Never Smoker   ??? Smokeless tobacco: Not on file   ??? Alcohol use Not on file   ??? Drug use: Not on file   ??? Sexual activity: Not on file     Other Topics Concern   ??? Not on file     Social History Narrative         ALLERGIES: Review of patient's allergies indicates no known allergies.    Review of Systems   Constitutional: Positive for appetite change. Negative for activity change, chills, fatigue and fever.   HENT: Negative for congestion, drooling, ear discharge, ear pain, rhinorrhea and sinus pressure.    Eyes: Negative for photophobia, redness and visual disturbance.    Respiratory: Negative for cough, shortness of breath, wheezing and stridor.    Gastrointestinal: Positive for abdominal pain, nausea and vomiting. Negative for constipation and diarrhea.   Genitourinary: Negative for decreased urine volume, dysuria, menstrual problem, vaginal bleeding, vaginal discharge and vaginal pain.   Neurological: Negative for dizziness, seizures, syncope, light-headedness, numbness and headaches.   All other systems reviewed and are negative.      Vitals:    07/22/15 1720   BP: 107/64   Pulse: 68   Resp: 18   Temp: 98.3 ??F (36.8 ??C)   SpO2: 99%   Weight: 64.8 kg (142 lb 13.7 oz)            Physical Exam   Constitutional: She is oriented to person, place, and time. She appears well-developed and well-nourished. No distress.   Patient rocking in pain   HENT:   Head: Normocephalic and atraumatic.   Right Ear: External ear normal.   Left Ear: External ear normal.   Nose: Nose normal.   Mouth/Throat: Oropharynx is clear and moist. No oropharyngeal exudate.   Eyes: Conjunctivae are normal. Right eye exhibits no discharge. Left eye exhibits no discharge.   Neck: Normal range of motion. Neck supple.  Cardiovascular: Normal rate, regular rhythm, normal heart sounds and intact distal pulses.  Exam reveals no gallop and no friction rub.    No murmur heard.  Pulmonary/Chest: Effort normal and breath sounds normal. No respiratory distress. She has no wheezes. She has no rales. She exhibits no tenderness.   Abdominal: Soft. Bowel sounds are normal. She exhibits no distension and no mass. There is tenderness. There is guarding. There is no rebound.   TTP in all quadrants but notably more severe in the RLQ - points to the RLQ as the area of the greatest discomfort.   Musculoskeletal: Normal range of motion. She exhibits no edema.   Lymphadenopathy:     She has no cervical adenopathy.   Neurological: She is alert and oriented to person, place, and time. She exhibits normal muscle tone.    Skin: Skin is warm and dry. No rash noted. She is not diaphoretic. No erythema. No pallor.   Psychiatric: She has a normal mood and affect. Her behavior is normal. Thought content normal.   Nursing note and vitals reviewed.       MDM  Number of Diagnoses or Management Options  Cyst of right ovary:   Diagnosis management comments: 20 yo F with acute worsening of RLQ after EGD today - known ovarian cyst.  Given the patient's history - concern for torsion of the ovary.  IV placed - zofran, NS bolus and morphine 3 mg given.  US of the ovary obtained - good blood flow seen.  Patient reports pain slightly better - additional 2 mg given.  CBC and CMP reassuring.  On re-evaluation patient resting comfortably reporting that she feels better.    [facility] - agrees with management and has no other recommendations.  Follow-up with Dr. Bethann GooJefferson tomorrow.  Discussed with Dr. Dalene SeltzerBradenham of GI - no further recommendations.  Agrees with management and does not require any further imaging or investigation at this time.    Patient comfortable with discharge.  Unsure of number of Norco remaining - will provided script for 10 tabs.       Amount and/or Complexity of Data Reviewed  Clinical lab tests: ordered and reviewed  Tests in the radiology section of CPT??: ordered and reviewed  Tests in the medicine section of CPT??: ordered and reviewed  Review and summarize past medical records: yes  Discuss the patient with other providers: yes  Independent visualization of images, tracings, or specimens: yes    Risk of Complications, Morbidity, and/or Mortality  Presenting problems: moderate  Diagnostic procedures: moderate  Management options: moderate    Patient Progress  Patient progress: improved    ED Course       Procedures

## 2015-07-22 NOTE — ED Notes (Signed)
Pt discharged home with parent/guardian. Pt acting age appropriately, respirations regular and unlabored, cap refill less than two seconds. Skin pink, dry and warm. Lungs clear bilaterally. No further complaints at this time. Parent/guardian verbalized understanding of discharge paperwork and has no further questions at this time.    Education provided about continuation of care, follow up care and medication administration. Parent/guardian able to provide teach back about discharge instructions.

## 2015-07-22 NOTE — ED Notes (Signed)
Pt awake, alert and sitting up in bed.  Pt's abdomen soft but tender to palpation through out.  Pt's respirations are regular, clear and unlabored. Pt's mother at bedside.

## 2015-07-22 NOTE — ED Notes (Signed)
Patient provided with water.

## 2015-07-22 NOTE — ED Triage Notes (Signed)
Triage:  Pt's mother states "we were sent her by her OB and GI for CT".

## 2015-07-22 NOTE — ED Notes (Signed)
Pt awake, alert and sitting up in bed.  Pt's abdomen soft, but tender to palpation throughout.  Pt's skin is warm to touch. Pt in no apparent distress.

## 2015-07-23 MED ORDER — HYDROCODONE-ACETAMINOPHEN 5 MG-325 MG TAB
5-325 mg | ORAL_TABLET | Freq: Three times a day (TID) | ORAL | 0 refills | Status: DC | PRN
Start: 2015-07-23 — End: 2018-05-02

## 2015-08-03 ENCOUNTER — Encounter

## 2015-08-30 ENCOUNTER — Ambulatory Visit: Payer: BLUE CROSS/BLUE SHIELD | Primary: Pediatrics

## 2015-09-06 ENCOUNTER — Ambulatory Visit: Payer: BLUE CROSS/BLUE SHIELD | Primary: Pediatrics

## 2016-04-24 ENCOUNTER — Inpatient Hospital Stay (HOSPITAL_COMMUNITY)
Admission: AD | Admit: 2016-04-24 | Discharge: 2016-04-24 | Disposition: A | Discharge status: Home / Self Care | Payer: BLUE CROSS/BLUE SHIELD | Source: Ambulatory Visit | Attending: Obstetrics and Gynecology | Admitting: Obstetrics and Gynecology

## 2016-04-24 ENCOUNTER — Inpatient Hospital Stay (HOSPITAL_COMMUNITY): Payer: BLUE CROSS/BLUE SHIELD

## 2016-04-24 ENCOUNTER — Encounter (HOSPITAL_COMMUNITY): Payer: Self-pay | Admitting: *Deleted

## 2016-04-24 DIAGNOSIS — R109 Unspecified abdominal pain: Secondary | ICD-10-CM | POA: Diagnosis present

## 2016-04-24 DIAGNOSIS — F17200 Nicotine dependence, unspecified, uncomplicated: Secondary | ICD-10-CM | POA: Insufficient documentation

## 2016-04-24 DIAGNOSIS — K529 Noninfective gastroenteritis and colitis, unspecified: Secondary | ICD-10-CM | POA: Insufficient documentation

## 2016-04-24 DIAGNOSIS — R52 Pain, unspecified: Secondary | ICD-10-CM

## 2016-04-24 HISTORY — DX: Unspecified ovarian cyst, unspecified side: N83.209

## 2016-04-24 LAB — URINALYSIS, ROUTINE W REFLEX MICROSCOPIC
Bilirubin Urine: NEGATIVE
Glucose, UA: NEGATIVE mg/dL
Ketones, ur: NEGATIVE mg/dL
Leukocytes, UA: NEGATIVE
Nitrite: NEGATIVE
PROTEIN: 30 mg/dL — AB
Specific Gravity, Urine: 1.016 (ref 1.005–1.030)
pH: 9 — ABNORMAL HIGH (ref 5.0–8.0)

## 2016-04-24 LAB — WET PREP, GENITAL
CLUE CELLS WET PREP: NONE SEEN
Sperm: NONE SEEN
Trich, Wet Prep: NONE SEEN
Yeast Wet Prep HPF POC: NONE SEEN

## 2016-04-24 LAB — LIPASE, BLOOD: Lipase: 20 U/L (ref 11–51)

## 2016-04-24 LAB — CBC
HCT: 34.4 % — ABNORMAL LOW (ref 36.0–46.0)
Hemoglobin: 11.6 g/dL — ABNORMAL LOW (ref 12.0–15.0)
MCH: 26.2 pg (ref 26.0–34.0)
MCHC: 33.7 g/dL (ref 30.0–36.0)
MCV: 77.8 fL — ABNORMAL LOW (ref 78.0–100.0)
PLATELETS: 359 10*3/uL (ref 150–400)
RBC: 4.42 MIL/uL (ref 3.87–5.11)
RDW: 17.2 % — AB (ref 11.5–15.5)
WBC: 9.6 10*3/uL (ref 4.0–10.5)

## 2016-04-24 LAB — AMYLASE: AMYLASE: 136 U/L — AB (ref 28–100)

## 2016-04-24 LAB — POCT PREGNANCY, URINE: PREG TEST UR: NEGATIVE

## 2016-04-24 MED ORDER — KETOROLAC TROMETHAMINE 30 MG/ML IJ SOLN
30.0000 mg | Freq: Once | INTRAMUSCULAR | Status: AC
  Administered 2016-04-24: 30 mg via INTRAMUSCULAR

## 2016-04-24 MED ORDER — ONDANSETRON 8 MG PO TBDP
8.0000 mg | ORAL_TABLET | Freq: Once | ORAL | Status: AC
  Administered 2016-04-24: 8 mg via ORAL
  Filled 2016-04-24: qty 1

## 2016-04-24 MED ORDER — KETOROLAC TROMETHAMINE 30 MG/ML IJ SOLN
30.0000 mg | Freq: Once | INTRAMUSCULAR | Status: DC
  Filled 2016-04-24: qty 1

## 2016-04-24 NOTE — MAU Provider Note (Signed)
History   Patient Colleen Bass is a 21 year old G0P0 here with complaints of generalized abdominal pain that started this morning. Her pain is accompanied with watery diarrhea. She has had a similar abdominal pain in the past and was diagnosed with a right ovarian cyst. She does not remember what medication she was given (this episode [place] She is currently menstruating and her pregnancy test was negative today. Patient ate a steak and cheese sub sandwich and cereal yesterday.   CSN: [id number]  Arrival date and time: 04/24/16 1433   First Provider Initiated Contact with Patient 04/24/16 1519      Chief Complaint  Patient presents with  . Abdominal Pain   Abdominal Pain  This is a new problem. The current episode started today. The onset quality is sudden. The problem occurs constantly. The problem has been unchanged. The pain is located in the suprapubic region and periumbilical region. The pain is at a severity of 9/10. The pain is severe. The quality of the pain is aching, burning and cramping. The abdominal pain does not radiate. Associated symptoms include diarrhea. Pertinent negatives include no anorexia, arthralgias, belching, constipation, dysuria, fever, flatus, frequency, headaches, hematochezia, hematuria, melena, myalgias, nausea, vomiting or weight loss. Nothing aggravates the pain. The pain is relieved by nothing.  Diarrhea   This is a new problem. The current episode started today. The problem occurs 2 to 4 times per day. The problem has been unchanged. The stool consistency is described as watery. Associated symptoms include abdominal pain. Pertinent negatives include no arthralgias, fever, headaches, increased  flatus, myalgias, vomiting or weight loss. Nothing aggravates the symptoms. She has tried nothing for the symptoms.    OB History    No data available      Past Medical History:  Diagnosis Date  . Ovarian cyst     History reviewed. No pertinent surgical  history.  History reviewed. No pertinent family history.  Social History  Substance Use Topics  . Smoking status: Current Some Day Smoker  . Smokeless tobacco: Current User  . Alcohol use Yes    Allergies: No Known Allergies  Prescriptions Prior to Admission  Medication Sig Dispense Refill Last Dose  . cephALEXin (KEFLEX) 500 MG capsule Take 1 capsule (500 mg total) by mouth 4 (four) times daily. 20 capsule 0   . HYDROcodone-acetaminophen (NORCO/VICODIN) 5-325 MG tablet Take 1 tablet by mouth every 6 (six) hours as needed. 4 tablet 0   . naproxen sodium (ANAPROX) 220 MG tablet Take 440 mg by mouth daily as needed (pain).   3 weeks    Review of Systems  Constitutional: Negative.  Negative for fever and weight loss.  HENT: Negative.   Eyes: Negative.   Respiratory: Negative.   Cardiovascular: Negative.   Gastrointestinal: Positive for abdominal pain and diarrhea. Negative for anorexia, constipation, flatus, hematochezia, melena, nausea and vomiting.  Genitourinary: Negative.  Negative for dysuria, frequency and hematuria.  Musculoskeletal: Negative.  Negative for arthralgias and myalgias.  Skin: Negative.   Neurological: Negative.  Negative for headaches.  Endo/Heme/Allergies: Negative.   Psychiatric/Behavioral: Negative.    Physical Exam   Blood pressure (!) 107/51, pulse 64, temperature 97.6 F (36.4 C), temperature source Oral, resp. rate 18, height 5\' 4"  (1.626 m), weight 165 lb 6.4 oz (75 kg), last menstrual period 04/21/2016, SpO2 99 %.  Physical Exam  Constitutional: She is oriented to person, place, and time. She appears well-developed.  HENT:  Head: Normocephalic.  Neck: Normal range  of motion.  Respiratory: Effort normal. No respiratory distress. She has no wheezes. She has no rales. She exhibits no tenderness.  GI: Soft. She exhibits no distension and no mass. There is tenderness. There is no rebound and no guarding.  Musculoskeletal: Normal range of motion.   Neurological: She is alert and oriented to person, place, and time.  Skin: Skin is warm and dry.    MAU Course  Procedures  MDM -UA: no signs of dehyration -UPT: Negative -[place] is normal, no evidence of torsion.  -CBC, amylase, lipase: normal  Assessment and Plan   1. Gastroenteritis   2. Pain   3. Abdominal pain    2.  Vitals:   04/24/16 1509  BP: (!) 107/51  Pulse: 64  Resp: 18  Temp: 97.6 F (36.4 C)    3. VSS: No evidence of infection. Patient to be D/C home with comfort measures. Recommend BRAT diet and rest. Patient to [facility] ED if she develops increased diarrhea, extreme vomiting, or worsening symptoms.    Charlesetta Garibaldi Tidus Upchurch CNM 04/24/2016, 3:28 PM

## 2016-04-24 NOTE — MAU Note (Signed)
Pt states that she has a cyst on one of her ovaries and it is causing her pain again.  Pt states that she thinks the cyst is on the right side but the pain is all over.

## 2016-04-24 NOTE — Discharge Instructions (Signed)

## 2016-04-25 LAB — GC/CHLAMYDIA PROBE AMP (~~LOC~~) NOT AT ARMC
CHLAMYDIA, DNA PROBE: NEGATIVE
NEISSERIA GONORRHEA: NEGATIVE

## 2017-06-26 ENCOUNTER — Encounter: Attending: Family Medicine | Primary: Pediatrics

## 2018-05-02 ENCOUNTER — Ambulatory Visit: Primary: Pediatrics

## 2018-05-02 ENCOUNTER — Ambulatory Visit: Admit: 2018-05-03 | Payer: PRIVATE HEALTH INSURANCE | Primary: Pediatrics

## 2018-05-02 DIAGNOSIS — J329 Chronic sinusitis, unspecified: Secondary | ICD-10-CM

## 2018-05-02 NOTE — Patient Instructions (Addendum)
Sinusitis: Care Instructions  Your Care Instructions    Sinusitis is an infection of the lining of the sinus cavities in your head. Sinusitis often follows a cold. It causes pain and pressure in your head and face.  In most cases, sinusitis gets better on its own in 1 to 2 weeks. But some mild symptoms may last for several weeks. Sometimes antibiotics are needed.  Follow-up care is a key part of your treatment and safety. Be sure to make and go to all appointments, and call your doctor if you are having problems. It's also a good idea to know your test results and keep a list of the medicines you take.  How can you care for yourself at home?  ?? Take an over-the-counter pain medicine, such as acetaminophen (Tylenol), ibuprofen (Advil, Motrin), or naproxen (Aleve). Read and follow all instructions on the label.  ?? If the doctor prescribed antibiotics, take them as directed. Do not stop taking them just because you feel better. You need to take the full course of antibiotics.  ?? Be careful when taking over-the-counter cold or flu medicines and Tylenol at the same time. Many of these medicines have acetaminophen, which is Tylenol. Read the labels to make sure that you are not taking more than the recommended dose. Too much acetaminophen (Tylenol) can be harmful.  ?? Breathe warm, moist air from a steamy shower, a hot bath, or a sink filled with hot water. Avoid cold, dry air. Using a humidifier in your home may help. Follow the directions for cleaning the machine.  ?? Use saline (saltwater) nasal washes to help keep your nasal passages open and wash out mucus and bacteria. You can buy saline nose drops at a grocery store or drugstore. Or you can make your own at home by adding 1 teaspoon of salt and 1 teaspoon of baking soda to 2 cups of distilled water. If you make your own, fill a bulb syringe with the solution, insert the tip into your nostril, and squeeze gently. Blow your nose.   ?? Put a hot, wet towel or a warm gel pack on your face 3 or 4 times a day for 5 to 10 minutes each time.  ?? Try a decongestant nasal spray like oxymetazoline (Afrin). Do not use it for more than 3 days in a row. Using it for more than 3 days can make your congestion worse.  When should you call for help?  Call your doctor now or seek immediate medical care if:  ?? ?? You have new or worse swelling or redness in your face or around your eyes.   ?? ?? You have a new or higher fever.   ??Watch closely for changes in your health, and be sure to contact your doctor if:  ?? ?? You have new or worse facial pain.   ?? ?? The mucus from your nose becomes thicker (like pus) or has new blood in it.   ?? ?? You are not getting better as expected.   Where can you learn more?  Go to http://www.healthwise.net/GoodHelpConnections.  Enter I933 in the search box to learn more about "Sinusitis: Care Instructions."  Current as of: February 11, 2017  Content Version: 12.2  ?? 2006-2019 [employer]. Care instructions adapted under license by [employer] (which disclaims liability or warranty for this information). If you have questions about a medical condition or this instruction, always ask your healthcare professional. Healthwise, Inc[employer] disclaims any warranty or liability for your   use of this information.

## 2018-05-02 NOTE — Progress Notes (Signed)
Headache    The history is provided by the patient. This is a new problem. The current episode started yesterday (has had 4 days of cough, congestion, runny nose). The problem occurs constantly. The problem has not changed since onset.The headache is aggravated by bright light and nausea. The pain is located in the frontal region. The quality of the pain is described as throbbing. The pain is at a severity of 6/10. Associated symptoms include nausea. Pertinent negatives include no anorexia, no fever, no malaise/fatigue, no chest pressure, no palpitations, no syncope, no shortness of breath, no tingling, no dizziness, no visual change and no vomiting. She has tried NSAIDs for the symptoms. The treatment provided no relief.        History reviewed. No pertinent past medical history.     History reviewed. No pertinent surgical history.      History reviewed. No pertinent family history.     Social History     Socioeconomic History   ??? Marital status: SINGLE     Spouse name: Not on file   ??? Number of children: Not on file   ??? Years of education: Not on file   ??? Highest education level: Not on file   Occupational History   ??? Not on file   Social Needs   ??? Financial resource strain: Not on file   ??? Food insecurity:     Worry: Not on file     Inability: Not on file   ??? Transportation needs:     Medical: Not on file     Non-medical: Not on file   Tobacco Use   ??? Smoking status: Never Smoker   ??? Smokeless tobacco: Never Used   Substance and Sexual Activity   ??? Alcohol use: Not on file   ??? Drug use: Not on file   ??? Sexual activity: Not on file   Lifestyle   ??? Physical activity:     Days per week: Not on file     Minutes per session: Not on file   ??? Stress: Not on file   Relationships   ??? Social connections:     Talks on phone: Not on file     Gets together: Not on file     Attends religious service: Not on file     Active member of club or organization: Not on file      Attends meetings of clubs or organizations: Not on file     Relationship status: Not on file   ??? Intimate partner violence:     Fear of current or ex partner: Not on file     Emotionally abused: Not on file     Physically abused: Not on file     Forced sexual activity: Not on file   Other Topics Concern   ??? Not on file   Social History Narrative   ??? Not on file                ALLERGIES: Patient has no known allergies.    Review of Systems   Constitutional: Negative for activity change, appetite change, chills, fever and malaise/fatigue.   HENT: Positive for congestion, rhinorrhea, sinus pressure and sinus pain. Negative for ear pain, sore throat and trouble swallowing.    Respiratory: Positive for cough. Negative for shortness of breath and wheezing.    Cardiovascular: Negative for chest pain, palpitations and syncope.   Gastrointestinal: Positive for nausea. Negative for anorexia and vomiting.   Musculoskeletal: Negative  for myalgias.   Neurological: Positive for headaches. Negative for dizziness and tingling.   Hematological: Positive for adenopathy.       Vitals:    05/02/18 1946   BP: 119/72   Pulse: 81   Resp: 16   Temp: 97.6 ??F (36.4 ??C)   SpO2: 100%   Weight: 171 lb (77.6 kg)   Height: 5\' 5"  (1.651 m)       Physical Exam  Vitals signs and nursing note reviewed.   Constitutional:       General: She is not in acute distress.     Appearance: She is well-developed. She is not diaphoretic.   HENT:      Right Ear: Tympanic membrane, ear canal and external ear normal.      Left Ear: Tympanic membrane, ear canal and external ear normal.      Nose: Congestion and rhinorrhea present.      Right Sinus: Maxillary sinus tenderness and frontal sinus tenderness present.      Left Sinus: Maxillary sinus tenderness and frontal sinus tenderness present.      Mouth/Throat:      Pharynx: No oropharyngeal exudate or posterior oropharyngeal erythema.      Tonsils: No tonsillar abscesses.   Cardiovascular:       Rate and Rhythm: Normal rate and regular rhythm.      Heart sounds: Normal heart sounds.   Pulmonary:      Effort: Pulmonary effort is normal. No respiratory distress.      Breath sounds: Normal breath sounds. No wheezing or rales.   Lymphadenopathy:      Cervical: Cervical adenopathy present.   Neurological:      Mental Status: She is alert.   Psychiatric:         Behavior: Behavior normal.         Thought Content: Thought content normal.         Judgment: Judgment normal.         MDM    ICD-10-CM ICD-9-CM   1. Sinusitis, unspecified chronicity, unspecified location J32.9 473.9   2. Acute nonintractable headache, unspecified headache type R51 784.0       Orders Placed This Encounter   ??? fluticasone propionate (FLONASE) 50 mcg/actuation nasal spray     Sig: 2 Sprays by Both Nostrils route daily for 14 days.     Dispense:  1 Bottle     Refill:  0   ??? amoxicillin (AMOXIL) 875 mg tablet     Sig: Take 1 Tab by mouth every twelve (12) hours for 10 days.     Dispense:  20 Tab     Refill:  0   ??? ketorolac tromethamine (TORADOL) 60 mg/2 mL injection 30 mg      Increase fluids  The patient is to follow up with PCP [facility].     If signs and symptoms become worse the pt is to go to the ER.         Procedures

## 2018-05-02 NOTE — Progress Notes (Signed)
Headache    The history is provided by the patient. This is a new problem. The current episode started yesterday (has had 4 days of cough, congestion, runny nose). The problem occurs constantly. The problem has not changed since onset.The headache is aggravated by bright light and nausea. The pain is located in the frontal region. The quality of the pain is described as throbbing. The pain is at a severity of 6/10. Associated symptoms include nausea. Pertinent negatives include no anorexia, no fever, no malaise/fatigue, no chest pressure, no palpitations, no syncope, no shortness of breath, no tingling, no dizziness, no visual change and no vomiting. She has tried NSAIDs for the symptoms. The treatment provided no relief.        History reviewed. No pertinent past medical history.     History reviewed. No pertinent surgical history.      History reviewed. No pertinent family history.     Social History     Socioeconomic History   ??? Marital status: SINGLE     Spouse name: Not on file   ??? Number of children: Not on file   ??? Years of education: Not on file   ??? Highest education level: Not on file   Occupational History   ??? Not on file   Social Needs   ??? Financial resource strain: Not on file   ??? Food insecurity:     Worry: Not on file     Inability: Not on file   ??? Transportation needs:     Medical: Not on file     Non-medical: Not on file   Tobacco Use   ??? Smoking status: Never Smoker   ??? Smokeless tobacco: Never Used   Substance and Sexual Activity   ??? Alcohol use: Not on file   ??? Drug use: Not on file   ??? Sexual activity: Not on file   Lifestyle   ??? Physical activity:     Days per week: Not on file     Minutes per session: Not on file   ??? Stress: Not on file   Relationships   ??? Social connections:     Talks on phone: Not on file     Gets together: Not on file     Attends religious service: Not on file     Active member of club or organization: Not on file     Attends meetings of clubs or organizations: Not on file      Relationship status: Not on file   ??? Intimate partner violence:     Fear of current or ex partner: Not on file     Emotionally abused: Not on file     Physically abused: Not on file     Forced sexual activity: Not on file   Other Topics Concern   ??? Not on file   Social History Narrative   ??? Not on file                ALLERGIES: Patient has no known allergies.    Review of Systems   Constitutional: Negative for activity change, appetite change, chills, fever and malaise/fatigue.   HENT: Positive for congestion, rhinorrhea, sinus pressure and sinus pain. Negative for ear pain, sore throat and trouble swallowing.    Respiratory: Positive for cough. Negative for shortness of breath and wheezing.    Cardiovascular: Negative for chest pain, palpitations and syncope.   Gastrointestinal: Positive for nausea. Negative for anorexia and vomiting.   Musculoskeletal: Negative  for myalgias.   Neurological: Positive for headaches. Negative for dizziness and tingling.   Hematological: Positive for adenopathy.       Vitals:    05/02/18 1946   BP: 119/72   Pulse: 81   Resp: 16   Temp: 97.6 ??F (36.4 ??C)   SpO2: 100%   Weight: 171 lb (77.6 kg)   Height: 5\' 5"  (1.651 m)       Physical Exam  Vitals signs and nursing note reviewed.   Constitutional:       General: She is not in acute distress.     Appearance: She is well-developed. She is not diaphoretic.   HENT:      Right Ear: Tympanic membrane, ear canal and external ear normal.      Left Ear: Tympanic membrane, ear canal and external ear normal.      Nose: Congestion and rhinorrhea present.      Right Sinus: Maxillary sinus tenderness and frontal sinus tenderness present.      Left Sinus: Maxillary sinus tenderness and frontal sinus tenderness present.      Mouth/Throat:      Pharynx: No oropharyngeal exudate or posterior oropharyngeal erythema.      Tonsils: No tonsillar abscesses.   Cardiovascular:      Rate and Rhythm: Normal rate and regular rhythm.      Heart sounds: Normal  heart sounds.   Pulmonary:      Effort: Pulmonary effort is normal. No respiratory distress.      Breath sounds: Normal breath sounds. No wheezing or rales.   Lymphadenopathy:      Cervical: Cervical adenopathy present.   Neurological:      Mental Status: She is alert.   Psychiatric:         Behavior: Behavior normal.         Thought Content: Thought content normal.         Judgment: Judgment normal.         MDM    ICD-10-CM ICD-9-CM   1. Sinusitis, unspecified chronicity, unspecified location J32.9 473.9   2. Acute nonintractable headache, unspecified headache type R51 784.0       Orders Placed This Encounter   ??? fluticasone propionate (FLONASE) 50 mcg/actuation nasal spray     Sig: 2 Sprays by Both Nostrils route daily for 14 days.     Dispense:  1 Bottle     Refill:  0   ??? amoxicillin (AMOXIL) 875 mg tablet     Sig: Take 1 Tab by mouth every twelve (12) hours for 10 days.     Dispense:  20 Tab     Refill:  0   ??? ketorolac tromethamine (TORADOL) 60 mg/2 mL injection 30 mg      Increase fluids  The patient is to follow up with PCP [facility].     If signs and symptoms become worse the pt is to go to the ER.         Procedures

## 2018-05-03 MED ORDER — FLUTICASONE 50 MCG/ACTUATION NASAL SPRAY, SUSP
50 mcg/actuation | Freq: Every day | NASAL | 0 refills | Status: AC
Start: 2018-05-03 — End: 2018-05-16

## 2018-05-03 MED ORDER — AMOXICILLIN 875 MG TAB
875 mg | ORAL_TABLET | Freq: Two times a day (BID) | ORAL | 0 refills | Status: AC
Start: 2018-05-03 — End: 2018-05-12

## 2018-05-03 MED ORDER — KETOROLAC TROMETHAMINE 60 MG/2 ML IM
60 mg/2 mL | Freq: Once | INTRAMUSCULAR | Status: AC
Start: 2018-05-03 — End: 2018-05-02
  Administered 2018-05-03: 01:00:00 via INTRAMUSCULAR

## 2020-06-30 ENCOUNTER — Encounter: Payer: BC Managed Care – PPO | Admitting: Obstetrics & Gynecology

## 2020-08-10 ENCOUNTER — Encounter: Payer: BC Managed Care – PPO | Admitting: Advanced Practice Midwife

## 2020-08-25 ENCOUNTER — Encounter: Payer: BLUE CROSS/BLUE SHIELD | Attending: Internal Medicine | Primary: Pediatrics

## 2020-10-08 ENCOUNTER — Encounter: Payer: BLUE CROSS/BLUE SHIELD | Attending: Internal Medicine | Primary: Pediatrics

## 2020-12-04 ENCOUNTER — Encounter (HOSPITAL_COMMUNITY): Payer: Self-pay

## 2020-12-04 ENCOUNTER — Emergency Department (HOSPITAL_COMMUNITY)
Admission: EM | Admit: 2020-12-04 | Discharge: 2020-12-05 | Disposition: A | Discharge status: Home / Self Care | Payer: BC Managed Care – PPO | Attending: Emergency Medicine | Admitting: Emergency Medicine

## 2020-12-04 ENCOUNTER — Other Ambulatory Visit: Payer: Self-pay

## 2020-12-04 DIAGNOSIS — R109 Unspecified abdominal pain: Secondary | ICD-10-CM | POA: Diagnosis not present

## 2020-12-04 DIAGNOSIS — F17200 Nicotine dependence, unspecified, uncomplicated: Secondary | ICD-10-CM | POA: Diagnosis not present

## 2020-12-04 DIAGNOSIS — R1033 Periumbilical pain: Secondary | ICD-10-CM | POA: Diagnosis not present

## 2020-12-04 DIAGNOSIS — R8290 Unspecified abnormal findings in urine: Secondary | ICD-10-CM

## 2020-12-04 DIAGNOSIS — R112 Nausea with vomiting, unspecified: Secondary | ICD-10-CM | POA: Diagnosis not present

## 2020-12-04 LAB — COMPREHENSIVE METABOLIC PANEL
ALT: 21 U/L (ref 0–44)
AST: 26 U/L (ref 15–41)
Albumin: 5 g/dL (ref 3.5–5.0)
Alkaline Phosphatase: 54 U/L (ref 38–126)
Anion gap: 11 (ref 5–15)
BUN: 13 mg/dL (ref 6–20)
CO2: 26 mmol/L (ref 22–32)
Calcium: 10.3 mg/dL (ref 8.9–10.3)
Chloride: 106 mmol/L (ref 98–111)
Creatinine, Ser: 0.89 mg/dL (ref 0.44–1.00)
GFR, Estimated: >60 mL/min (ref >60)
Glucose, Bld: 101 mg/dL — ABNORMAL HIGH (ref 70–99)
Potassium: 3.6 mmol/L (ref 3.5–5.1)
Sodium: 143 mmol/L (ref 135–145)
Total Bilirubin: 1.5 mg/dL — ABNORMAL HIGH (ref 0.3–1.2)
Total Protein: 8.6 g/dL — ABNORMAL HIGH (ref 6.5–8.1)

## 2020-12-04 LAB — URINALYSIS, ROUTINE W REFLEX MICROSCOPIC
Bacteria, UA: NONE SEEN
Bilirubin Urine: NEGATIVE
Glucose, UA: NEGATIVE mg/dL
Ketones, ur: 20 mg/dL — AB
Nitrite: NEGATIVE
Protein, ur: 30 mg/dL — AB
Specific Gravity, Urine: 1.025 (ref 1.005–1.030)
pH: 6 (ref 5.0–8.0)

## 2020-12-04 LAB — CBC WITH DIFFERENTIAL/PLATELET
Abs Immature Granulocytes: 0.03 10*3/uL (ref 0.00–0.07)
Basophils Absolute: 0 10*3/uL (ref 0.0–0.1)
Basophils Relative: 0 %
Eosinophils Absolute: 0 10*3/uL (ref 0.0–0.5)
Eosinophils Relative: 0 %
HCT: 35.7 % — ABNORMAL LOW (ref 36.0–46.0)
Hemoglobin: 12 g/dL (ref 12.0–15.0)
Immature Granulocytes: 0 %
Lymphocytes Relative: 16 %
Lymphs Abs: 1.9 10*3/uL (ref 0.7–4.0)
MCH: 25.6 pg — ABNORMAL LOW (ref 26.0–34.0)
MCHC: 33.6 g/dL (ref 30.0–36.0)
MCV: 76.3 fL — ABNORMAL LOW (ref 80.0–100.0)
Monocytes Absolute: 1.2 10*3/uL — ABNORMAL HIGH (ref 0.1–1.0)
Monocytes Relative: 10 %
Neutro Abs: 8.4 10*3/uL — ABNORMAL HIGH (ref 1.7–7.7)
Neutrophils Relative %: 74 %
Platelets: 402 10*3/uL — ABNORMAL HIGH (ref 150–400)
RBC: 4.68 MIL/uL (ref 3.87–5.11)
RDW: 17.4 % — ABNORMAL HIGH (ref 11.5–15.5)
WBC: 11.5 10*3/uL — ABNORMAL HIGH (ref 4.0–10.5)
nRBC: 0 % (ref 0.0–0.2)

## 2020-12-04 LAB — LIPASE, BLOOD: Lipase: 36 U/L (ref 11–51)

## 2020-12-04 LAB — I-STAT BETA HCG BLOOD, ED (MC, WL, AP ONLY): I-stat hCG, quantitative: <5 m[IU]/mL (ref <5)

## 2020-12-04 LAB — ETHANOL: Alcohol, Ethyl (B): <10 mg/dL (ref <10)

## 2020-12-04 NOTE — ED Triage Notes (Signed)
Pt complains of not feeling well since drinking 3  [place] ice teas on Thursday night. Pt states that she is unable to eat or drink anything and now has stomach pain.

## 2020-12-04 NOTE — ED Notes (Signed)
Dark, Lt Green & Purple tubes drawn, will notify lab for processing when ordered.

## 2020-12-04 NOTE — ED Notes (Signed)
Pt advised being unable to void at this time, I provided the pt with a urine specimen cup.

## 2020-12-04 NOTE — ED Provider Notes (Signed)
Emergency Medicine Provider Triage Evaluation Note  Colleen Bass , a 25 y.o. female  was evaluated in triage.  Pt complains of not feeling well since Thursday.  She had 3 [place] ice tea as and since then has been unable to eat or drink anything as when she does she has stomach pain.  She indicates that her stomach hurts in the lower abdomen.  Her pain is even worse Water.  No fevers..  Review of Systems  Positive: Abdominal pain, nausea, vomiting Negative: Fevers  Physical Exam  BP 113/84 (BP Location: Left Arm)   Pulse 78   Temp 98.8 F (37.1 C) (Oral)   Resp 17   Ht 5\' 5"  (1.651 m)   Wt 67.1 kg   SpO2 99%   BMI 24.63 kg/m  Gen:   Awake, no distress   Resp:  Normal effort  MSK:   Moves extremities without difficulty  Other:  Tenderness to palpation primarily over mid abdomen and lower abdomen.  Medical Decision Making  Medically screening exam initiated at 8:41 PM.  Appropriate orders placed.  Falana Clagg was informed that the remainder of the evaluation will be completed by another provider, this initial triage assessment does not replace that evaluation, and the importance of remaining in the ED until their evaluation is complete.  Note: Portions of this report may have been transcribed using voice recognition software. Every effort was made to ensure accuracy; however, inadvertent computerized transcription errors may be present    Randal Buba 12/04/20 2042    2043, MD 12/05/20 [phone]

## 2020-12-04 NOTE — ED Notes (Signed)
Lab Contacted to process ordered tubes.

## 2020-12-04 NOTE — ED Notes (Signed)
Pt ambulatory in ED lobby. 

## 2020-12-05 ENCOUNTER — Emergency Department (HOSPITAL_COMMUNITY): Payer: BC Managed Care – PPO

## 2020-12-05 ENCOUNTER — Encounter (HOSPITAL_COMMUNITY): Payer: Self-pay

## 2020-12-05 DIAGNOSIS — R109 Unspecified abdominal pain: Secondary | ICD-10-CM | POA: Diagnosis not present

## 2020-12-05 MED ORDER — SODIUM CHLORIDE 0.9 % IV BOLUS
1000.0000 mL | Freq: Once | INTRAVENOUS | Status: AC
  Administered 2020-12-05: 1000 mL via INTRAVENOUS

## 2020-12-05 MED ORDER — ONDANSETRON HCL 4 MG/2ML IJ SOLN
4.0000 mg | Freq: Once | INTRAMUSCULAR | Status: AC
  Administered 2020-12-05: 4 mg via INTRAVENOUS
  Filled 2020-12-05: qty 2

## 2020-12-05 MED ORDER — MORPHINE SULFATE (PF) 4 MG/ML IV SOLN
4.0000 mg | Freq: Once | INTRAVENOUS | Status: AC
  Administered 2020-12-05: 4 mg via INTRAVENOUS
  Filled 2020-12-05: qty 1

## 2020-12-05 MED ORDER — IOHEXOL 350 MG/ML SOLN
80.0000 mL | Freq: Once | INTRAVENOUS | Status: AC | PRN
  Administered 2020-12-05: 80 mL via INTRAVENOUS

## 2020-12-05 MED ORDER — CEPHALEXIN 500 MG PO CAPS: 500.0000 mg | ORAL_CAPSULE | Freq: Two times a day (BID) | ORAL | 0 refills | Status: DC

## 2020-12-05 NOTE — ED Provider Notes (Signed)
[facility]-EMERGENCY DEPT Provider Note   CSN: [id number] Arrival date & time: 12/04/20  1949     History Chief Complaint  Patient presents with   Alcohol Problem    Colleen Bass is a 25 y.o. female.  Patient presents to the emergency department with a chief complaint of abdominal pain.  She states that the pain is in her central abdomen.  She states that the symptoms started 2 days ago.  She reports associated nausea and vomiting.  She is concerned that she has "alcohol poisoning."  She denies any fevers or chills.  Denies any dysuria, hematuria, or vaginal discharge.  She states that she is on her menstrual cycle.  She denies any other associated symptoms.  The history is provided by the patient. No language interpreter was used.      Past Medical History:  Diagnosis Date   Ovarian cyst     There are no problems to display for this patient.   History reviewed. No pertinent surgical history.   OB History   No obstetric history on file.     History reviewed. No pertinent family history.  Social History   Tobacco Use   Smoking status: Some Days   Smokeless tobacco: Current  Substance Use Topics   Alcohol use: Yes   Drug use: No    Home Medications Prior to Admission medications   Medication Sig Start Date End Date Taking? Authorizing Provider  ibuprofen (ADVIL,MOTRIN) 200 MG tablet Take 400 mg by mouth every 6 (six) hours as needed for moderate pain or cramping.    [provider]    Allergies    Patient has no known allergies.  Review of Systems   Review of Systems  All other systems reviewed and are negative.  Physical Exam Updated Vital Signs BP 118/65   Pulse 62   Temp 98.8 F (37.1 C) (Oral)   Resp 17   Ht 5\' 5"  (1.651 m)   Wt 67.1 kg   SpO2 100%   BMI 24.63 kg/m   Physical Exam Vitals and nursing note reviewed.  Constitutional:      General: She is not in acute distress.    Appearance: She is  well-developed.  HENT:     Head: Normocephalic and atraumatic.  Eyes:     Conjunctiva/sclera: Conjunctivae normal.  Cardiovascular:     Rate and Rhythm: Normal rate and regular rhythm.     Heart sounds: No murmur heard. Pulmonary:     Effort: Pulmonary effort is normal. No respiratory distress.     Breath sounds: Normal breath sounds.  Abdominal:     Palpations: Abdomen is soft.     Tenderness: There is abdominal tenderness.  Musculoskeletal:        General: Normal range of motion.     Cervical back: Neck supple.  Skin:    General: Skin is warm and dry.  Neurological:     Mental Status: She is alert and oriented to person, place, and time.  Psychiatric:        Mood and Affect: Mood normal.        Behavior: Behavior normal.    ED Results / Procedures / Treatments   Labs (all labs ordered are listed, but only abnormal results are displayed) Labs Reviewed  CBC WITH DIFFERENTIAL/PLATELET - Abnormal; Notable for the following components:      Result Value   WBC 11.5 (*)    HCT 35.7 (*)    MCV 76.3 (*)  MCH 25.6 (*)    RDW 17.4 (*)    Platelets 402 (*)    Neutro Abs 8.4 (*)    Monocytes Absolute 1.2 (*)    All other components within normal limits  COMPREHENSIVE METABOLIC PANEL - Abnormal; Notable for the following components:   Glucose, Bld 101 (*)    Total Protein 8.6 (*)    Total Bilirubin 1.5 (*)    All other components within normal limits  URINALYSIS, ROUTINE W REFLEX MICROSCOPIC - Abnormal; Notable for the following components:   APPearance HAZY (*)    Hgb urine dipstick LARGE (*)    Ketones, ur 20 (*)    Protein, ur 30 (*)    Leukocytes,Ua SMALL (*)    All other components within normal limits  ETHANOL  LIPASE, BLOOD  I-STAT BETA HCG BLOOD, ED ([facility], [facility], [facility] ONLY)    EKG None  Radiology No results found.  Procedures Procedures   Medications Ordered in ED Medications  sodium chloride 0.9 % bolus 1,000 mL (has no administration in time range)   morphine 4 MG/ML injection 4 mg (has no administration in time range)  ondansetron (ZOFRAN) injection 4 mg (has no administration in time range)    ED Course  I have reviewed the triage vital signs and the nursing notes.  Pertinent labs & imaging results that were available during my care of the patient were reviewed by me and considered in my medical decision making (see chart for details).    MDM Rules/Calculators/A&P                           Patient here with central abdominal pain.  She reports associated nausea and vomiting.  She has some periumbilical tenderness.  She is also on her menstrual cycle.  Laboratory work-up is fairly reassuring.  Normal lipase.  Normal LFTs.  Doubt pancreatitis or cholecystitis.  Given the periumbilical tenderness, feel that imaging is indicated to rule out appendicitis.  This could simply be developing cystitis, or could be dysmenorrhea.  CT abdomen/pelvis is normal.  We will treat for cystitis with Keflex.  Patient's vital signs are stable.  She is in no acute distress.  She is nontoxic in appearance, and appears stable for discharge. Final Clinical Impression(s) / ED Diagnoses Final diagnoses:  Periumbilical abdominal pain  Abnormal urinalysis    Rx / DC Orders ED Discharge Orders     None        Roxy Horseman, PA-C 12/05/20 0247    Sabas Sous, MD 12/05/20 [phone]

## 2021-06-15 ENCOUNTER — Ambulatory Visit: Payer: Self-pay | Admitting: Nurse Practitioner

## 2021-11-17 ENCOUNTER — Ambulatory Visit (INDEPENDENT_AMBULATORY_CARE_PROVIDER_SITE_OTHER): Payer: BC Managed Care – PPO | Admitting: Nurse Practitioner

## 2021-11-17 ENCOUNTER — Encounter: Payer: Self-pay | Admitting: Nurse Practitioner

## 2021-11-17 VITALS — BP 102/72 | HR 76 | Resp 16 | Ht 65.5 in | Wt 169.0 lb

## 2021-11-17 DIAGNOSIS — Z8349 Family history of other endocrine, nutritional and metabolic diseases: Secondary | ICD-10-CM

## 2021-11-17 DIAGNOSIS — Z01419 Encounter for gynecological examination (general) (routine) without abnormal findings: Secondary | ICD-10-CM | POA: Diagnosis not present

## 2021-11-17 DIAGNOSIS — N898 Other specified noninflammatory disorders of vagina: Secondary | ICD-10-CM | POA: Diagnosis not present

## 2021-11-17 DIAGNOSIS — Z113 Encounter for screening for infections with a predominantly sexual mode of transmission: Secondary | ICD-10-CM

## 2021-11-17 NOTE — Progress Notes (Signed)
   Colleen Bass 1995-11-06 [id number]   History:  26 y.o. G0 presents as new patient to establish care. No GYN complaints. H/O irregular cycles, negative workup in the past. Was on COCs but did not feel it helped. Using withdrawal method for contraception. Normal pap history. Mother and sister with thyroid disease. Unsure of Gardasil, she will check with pediatrician.   Gynecologic History Patient's last menstrual period was 11/05/2021 (exact date). Period Cycle (Days):  (14-35) Period Duration (Days): 5-7 Menstrual Flow:  (starts heavy then moderate to light towards end) Menstrual Control: Tampon Dysmenorrhea: (!) Moderate Dysmenorrhea Symptoms: Cramping Contraception/Family planning: coitus interruptus Sexually active: Yes  Health Maintenance Last Pap: 2021 per patient. Results were: Normal Last mammogram: Not indicated Last colonoscopy: Not indicated Last Dexa: Not indicated  Past medical history, past surgical history, family history and social history were all reviewed and documented in the EPIC chart. Boyfriend. 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  ROS:  A ROS was performed and pertinent positives and negatives are included.  Exam:  Vitals:   11/17/21 0810  BP: 102/72  Pulse: 76  Resp: 16  Weight: 169 lb (76.7 kg)  Height: 5' 5.5" (1.664 m)   Body mass index is 27.7 kg/m.  General appearance:  Normal Thyroid:  Symmetrical, normal in size, without palpable masses or nodularity. Respiratory  Auscultation:  Clear without wheezing or rhonchi Cardiovascular  Auscultation:  Regular rate, without rubs, murmurs or gallops  Edema/varicosities:  Not grossly evident Abdominal  Soft,nontender, without masses, guarding or rebound.  Liver/spleen:  No organomegaly noted  Hernia:  None appreciated  Skin  Inspection:  Grossly normal Breasts: Examined lying and sitting.   Right: Without masses, retractions, nipple discharge or axillary  adenopathy.   Left: Without masses, retractions, nipple discharge or axillary adenopathy. Genitourinary   Inguinal/mons:  Normal without inguinal adenopathy  External genitalia:  Normal appearing vulva with no masses, tenderness, or lesions  BUS/Urethra/Skene's glands:  Normal  Vagina:  Discharge and odor present  Cervix:  Normal appearing without discharge or lesions  Uterus:  Normal in size, shape and contour.  Midline and mobile, nontender  Adnexa/parametria:     Rt: Normal in size, without masses or tenderness.   Lt: Normal in size, without masses or tenderness.  Anus and perineum: Normal  Digital rectal exam: Not indicated  Patient informed chaperone available to be present for breast and pelvic exam. Patient has requested no chaperone to be present. Patient has been advised what will be completed during breast and pelvic exam.   Assessment/Plan:  26 y.o. G0 to establish care.   Well female exam with routine gynecological exam - Plan: CBC with Differential/Platelet, Comprehensive metabolic panel. Education provided on SBEs, importance of preventative screenings, current guidelines, high calcium diet, regular exercise, and multivitamin daily.   Screening examination for STD (sexually transmitted disease) - Plan: RPR, HIV Antibody (routine testing w rflx), SureSwab Advanced Vaginitis Plus,TMA  Family history of thyroid disease - Plan: TSH. Mother and sister with thyroid disease.   Vaginal discharge - Plan: SureSwab Advanced Vaginitis Plus,TMA  Screening for cervical cancer - Normal Pap history.  Will repeat at 3-year interval per guidelines.  Return in 1 year for annual.     Olivia Mackie DNP, 8:29 AM 11/17/2021

## 2021-11-18 LAB — CBC WITH DIFFERENTIAL/PLATELET
Absolute Monocytes: 439 cells/uL (ref 200–950)
Basophils Absolute: 90 cells/uL (ref 0–200)
Basophils Relative: 2.1 %
Eosinophils Absolute: 271 cells/uL (ref 15–500)
Eosinophils Relative: 6.3 %
HCT: 37.9 % (ref 35.0–45.0)
Hemoglobin: 12.1 g/dL (ref 11.7–15.5)
Lymphs Abs: 1909 cells/uL (ref 850–3900)
MCH: 27.2 pg (ref 27.0–33.0)
MCHC: 31.9 g/dL — ABNORMAL LOW (ref 32.0–36.0)
MCV: 85.2 fL (ref 80.0–100.0)
MPV: 10.5 fL (ref 7.5–12.5)
Monocytes Relative: 10.2 %
Neutro Abs: 1591 cells/uL (ref 1500–7800)
Neutrophils Relative %: 37 %
Platelets: 348 10*3/uL (ref 140–400)
RBC: 4.45 10*6/uL (ref 3.80–5.10)
RDW: 15.4 % — ABNORMAL HIGH (ref 11.0–15.0)
Total Lymphocyte: 44.4 %
WBC: 4.3 10*3/uL (ref 3.8–10.8)

## 2021-11-18 LAB — COMPREHENSIVE METABOLIC PANEL
AG Ratio: 1.6 (calc) (ref 1.0–2.5)
ALT: 14 U/L (ref 6–29)
AST: 18 U/L (ref 10–30)
Albumin: 4.8 g/dL (ref 3.6–5.1)
Alkaline phosphatase (APISO): 57 U/L (ref 31–125)
BUN: 14 mg/dL (ref 7–25)
CO2: 27 mmol/L (ref 20–32)
Calcium: 10.3 mg/dL — ABNORMAL HIGH (ref 8.6–10.2)
Chloride: 103 mmol/L (ref 98–110)
Creat: 0.84 mg/dL (ref 0.50–0.96)
Globulin: 3 g/dL (calc) (ref 1.9–3.7)
Glucose, Bld: 82 mg/dL (ref 65–99)
Potassium: 4.6 mmol/L (ref 3.5–5.3)
Sodium: 138 mmol/L (ref 135–146)
Total Bilirubin: 0.5 mg/dL (ref 0.2–1.2)
Total Protein: 7.8 g/dL (ref 6.1–8.1)

## 2021-11-18 LAB — SURESWAB® ADVANCED VAGINITIS PLUS,TMA
C. trachomatis RNA, TMA: NOT DETECTED
CANDIDA SPECIES: NOT DETECTED
Candida glabrata: NOT DETECTED
N. gonorrhoeae RNA, TMA: NOT DETECTED
SURESWAB(R) ADV BACTERIAL VAGINOSIS(BV),TMA: POSITIVE — AB
TRICHOMONAS VAGINALIS (TV),TMA: NOT DETECTED

## 2021-11-18 LAB — RPR: RPR Ser Ql: NONREACTIVE

## 2021-11-18 LAB — TSH: TSH: 0.77 mIU/L

## 2021-11-18 LAB — HIV ANTIBODY (ROUTINE TESTING W REFLEX): HIV 1&2 Ab, 4th Generation: NONREACTIVE

## 2021-11-21 ENCOUNTER — Other Ambulatory Visit: Payer: Self-pay | Admitting: Nurse Practitioner

## 2021-11-21 DIAGNOSIS — B9689 Other specified bacterial agents as the cause of diseases classified elsewhere: Secondary | ICD-10-CM

## 2021-11-21 MED ORDER — METRONIDAZOLE 500 MG PO TABS: 500.0000 mg | ORAL_TABLET | Freq: Two times a day (BID) | ORAL | 0 refills | Status: DC

## 2022-06-22 ENCOUNTER — Ambulatory Visit: Payer: BC Managed Care – PPO | Admitting: Nurse Practitioner

## 2022-11-21 ENCOUNTER — Ambulatory Visit: Payer: BC Managed Care – PPO | Admitting: Nurse Practitioner

## 2022-12-18 ENCOUNTER — Ambulatory Visit: Payer: BC Managed Care – PPO | Admitting: Nurse Practitioner

## 2022-12-18 ENCOUNTER — Other Ambulatory Visit (HOSPITAL_COMMUNITY)
Admission: RE | Admit: 2022-12-18 | Discharge: 2022-12-18 | Disposition: A | Discharge status: Home / Self Care | Payer: BC Managed Care – PPO | Source: Ambulatory Visit | Attending: Nurse Practitioner | Admitting: Nurse Practitioner

## 2022-12-18 VITALS — BP 130/76 | HR 79 | Ht 65.0 in | Wt 193.0 lb

## 2022-12-18 DIAGNOSIS — Z23 Encounter for immunization: Secondary | ICD-10-CM

## 2022-12-18 DIAGNOSIS — Z113 Encounter for screening for infections with a predominantly sexual mode of transmission: Secondary | ICD-10-CM | POA: Insufficient documentation

## 2022-12-18 DIAGNOSIS — Z8349 Family history of other endocrine, nutritional and metabolic diseases: Secondary | ICD-10-CM | POA: Diagnosis not present

## 2022-12-18 DIAGNOSIS — Z124 Encounter for screening for malignant neoplasm of cervix: Secondary | ICD-10-CM | POA: Diagnosis not present

## 2022-12-18 DIAGNOSIS — Z01419 Encounter for gynecological examination (general) (routine) without abnormal findings: Secondary | ICD-10-CM | POA: Diagnosis not present

## 2022-12-18 DIAGNOSIS — R635 Abnormal weight gain: Secondary | ICD-10-CM | POA: Diagnosis not present

## 2022-12-18 NOTE — Progress Notes (Signed)
Colleen Bass 07-Jul-1995 [id number]   History:  27 y.o. G0 presents for annual exam. No GYN complaints. H/O irregular cycles, negative workup in the past. Was on COCs but did not feel it helped. Normal pap history. Has been struggling with weight gain despite consistent exercise since the new year. Doing combination of weight training and cardio. Feels she eats a healthy diet. Mother and sister with thyroid disease. Has not received Gardasil and would like to get.   Gynecologic History Patient's last menstrual period was 12/06/2022. Period Cycle (Days): 28 Period Duration (Days): 5-7 Period Pattern: Regular Menstrual Flow: Heavy Menstrual Control: Maxi pad Menstrual Control Change Freq (Hours): 2 Dysmenorrhea: (!) Severe Dysmenorrhea Symptoms: Cramping, Headache, Diarrhea, Nausea Contraception/Family planning: coitus interruptus Sexually active: Yes  Health Maintenance Last Pap: 2021 per patient. Results were: Normal Last mammogram: Not indicated Last colonoscopy: Not indicated Last Dexa: Not indicated  Past medical history, past surgical history, family history and social history were all reviewed and documented in the EPIC chart. Boyfriend. 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  ROS:  A ROS was performed and pertinent positives and negatives are included.  Exam:  Vitals:   12/18/22 1505  BP: 130/76  Pulse: 79  SpO2: 100%  Weight: 193 lb (87.5 kg)  Height: 5\' 5"  (1.651 m)    Body mass index is 32.12 kg/m.  General appearance:  Normal Thyroid:  Symmetrical, normal in size, without palpable masses or nodularity. Respiratory  Auscultation:  Clear without wheezing or rhonchi Cardiovascular  Auscultation:  Regular rate, without rubs, murmurs or gallops  Edema/varicosities:  Not grossly evident Abdominal  Soft,nontender, without masses, guarding or rebound.  Liver/spleen:  No organomegaly noted  Hernia:  None appreciated  Skin  Inspection:  Grossly  normal Breasts: Examined lying and sitting.   Right: Without masses, retractions, nipple discharge or axillary adenopathy.   Left: Without masses, retractions, nipple discharge or axillary adenopathy. Genitourinary   Inguinal/mons:  Normal without inguinal adenopathy  External genitalia:  Normal appearing vulva with no masses, tenderness, or lesions  BUS/Urethra/Skene's glands:  Normal  Vagina:  Discharge and odor present  Cervix:  Normal appearing without discharge or lesions  Uterus:  Normal in size, shape and contour.  Midline and mobile, nontender  Adnexa/parametria:     Rt: Normal in size, without masses or tenderness.   Lt: Normal in size, without masses or tenderness.  Anus and perineum: Normal  Digital rectal exam: Not indicated  Patient informed chaperone available to be present for breast and pelvic exam. Patient has requested no chaperone to be present. Patient has been advised what will be completed during breast and pelvic exam.   Assessment/Plan:  27 y.o. G0 for annual exam.   Well female exam with routine gynecological exam - Plan: CBC with Differential/Platelet, Comprehensive metabolic panel. Education provided on SBEs, importance of preventative screenings, current guidelines, high calcium diet, regular exercise, and multivitamin daily.   Screening for cervical cancer - Plan: Cytology - PAP( [facility]). Normal pap history.   Family history of thyroid disease - Plan: TSH. Mother and sister.   Screening examination for STD (sexually transmitted disease) - Plan: Cytology - PAP( [facility]). GC/CT/trich added to pap.   Need for HPV vaccination - Plan: HPV 9-valent vaccine,Recombinat  Weight gain - Plan: TSH. Has gained 24 pounds since visit 10/2021. Educated on important of calorie deficit, intermittent fasting, avoiding late night snacking and high calorie/sugar drinks.   Return in 1 year for annual.  Olivia Mackie DNP, 3:30 PM 12/18/2022

## 2022-12-19 LAB — CBC WITH DIFFERENTIAL/PLATELET
Absolute Monocytes: 649 {cells}/uL (ref 200–950)
Basophils Absolute: 83 {cells}/uL (ref 0–200)
Basophils Relative: 1.2 %
Eosinophils Absolute: 138 {cells}/uL (ref 15–500)
Eosinophils Relative: 2 %
HCT: 38.2 % (ref 35.0–45.0)
Hemoglobin: 12.3 g/dL (ref 11.7–15.5)
Lymphs Abs: 2374 {cells}/uL (ref 850–3900)
MCH: 27.7 pg (ref 27.0–33.0)
MCHC: 32.2 g/dL (ref 32.0–36.0)
MCV: 86 fL (ref 80.0–100.0)
MPV: 10.9 fL (ref 7.5–12.5)
Monocytes Relative: 9.4 %
Neutro Abs: 3657 cells/uL (ref 1500–7800)
Neutrophils Relative %: 53 %
Platelets: 328 10*3/uL (ref 140–400)
RBC: 4.44 10*6/uL (ref 3.80–5.10)
RDW: 14.9 % (ref 11.0–15.0)
Total Lymphocyte: 34.4 %
WBC: 6.9 10*3/uL (ref 3.8–10.8)

## 2022-12-19 LAB — COMPREHENSIVE METABOLIC PANEL
AG Ratio: 1.5 (calc) (ref 1.0–2.5)
ALT: 20 U/L (ref 6–29)
AST: 20 U/L (ref 10–30)
Albumin: 4.5 g/dL (ref 3.6–5.1)
Alkaline phosphatase (APISO): 57 U/L (ref 31–125)
BUN: 12 mg/dL (ref 7–25)
CO2: 23 mmol/L (ref 20–32)
Calcium: 9.8 mg/dL (ref 8.6–10.2)
Chloride: 105 mmol/L (ref 98–110)
Creat: 0.76 mg/dL (ref 0.50–0.96)
Globulin: 3.1 g/dL (ref 1.9–3.7)
Glucose, Bld: 88 mg/dL (ref 65–99)
Potassium: 4.3 mmol/L (ref 3.5–5.3)
Sodium: 138 mmol/L (ref 135–146)
Total Bilirubin: 0.6 mg/dL (ref 0.2–1.2)
Total Protein: 7.6 g/dL (ref 6.1–8.1)

## 2022-12-19 LAB — TSH: TSH: 0.85 m[IU]/L

## 2022-12-20 LAB — CYTOLOGY - PAP
Adequacy: ABSENT
Chlamydia: NEGATIVE
Comment: NEGATIVE
Comment: NEGATIVE
Comment: NORMAL
Diagnosis: NEGATIVE
Neisseria Gonorrhea: NEGATIVE
Trichomonas: NEGATIVE

## 2023-02-20 ENCOUNTER — Ambulatory Visit (INDEPENDENT_AMBULATORY_CARE_PROVIDER_SITE_OTHER): Payer: BC Managed Care – PPO

## 2023-02-20 DIAGNOSIS — Z23 Encounter for immunization: Secondary | ICD-10-CM

## 2023-02-28 IMAGING — CT CT ABD-PELV W/ CM
2 of 4 series · 16 of 46 positions shown, 18 images · IV contrast (omnipaque)
Comparison: None.

CLINICAL DATA: Abdominal pain

EXAM:
CT ABDOMEN AND PELVIS WITH CONTRAST
TECHNIQUE: Multidetector CT imaging of the abdomen and pelvis was performed
using the standard protocol following bolus administration of
intravenous contrast.
CONTRAST:  80mL OMNIPAQUE IOHEXOL 350 MG/ML SOLN

[Series 2: axial st · axial · 0.68mm/px · z∈[-474,-84]mm · 13 of 88 slices shown, 15 images]
[im 5/88  soft-tissue]
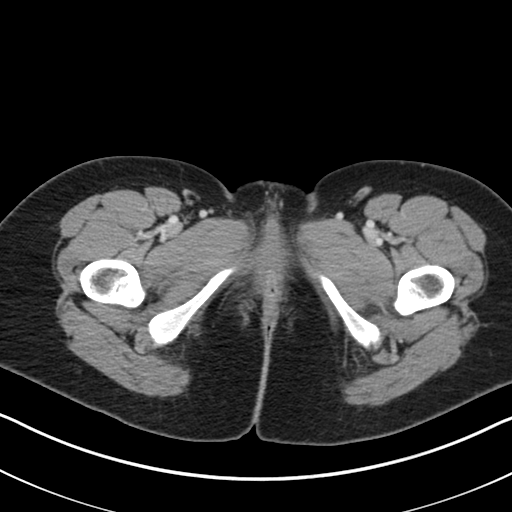
[im 5/88  bone]
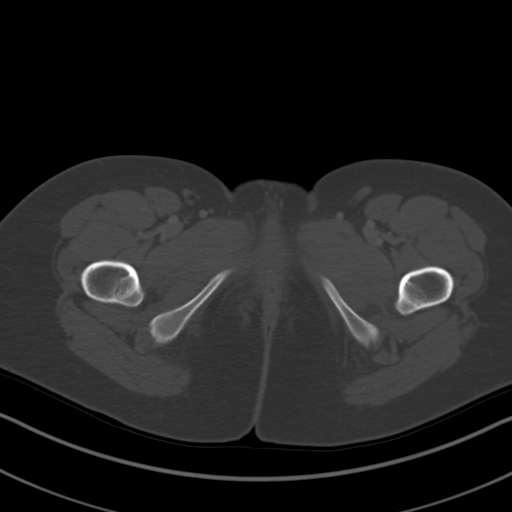
[im 14/88  soft-tissue]
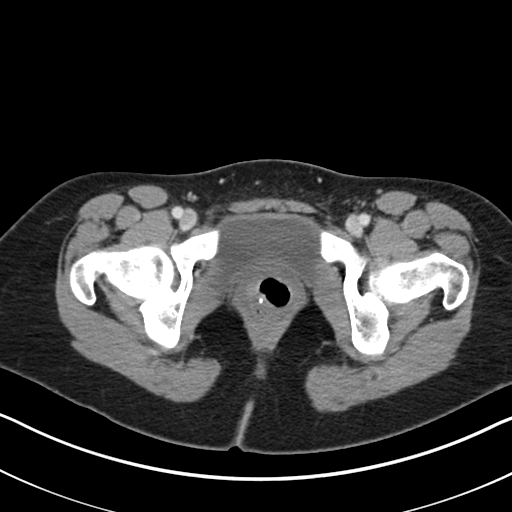
[im 19/88  soft-tissue]
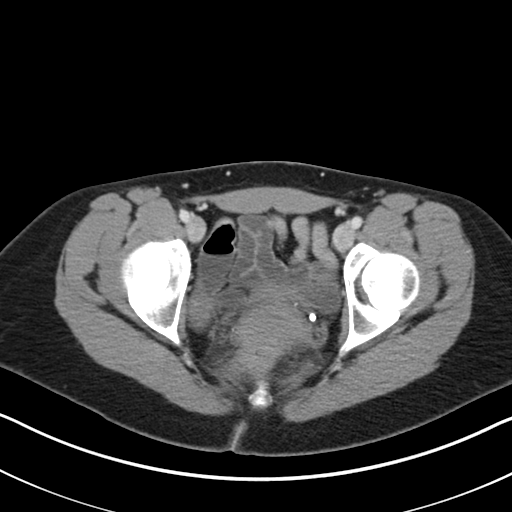
[im 23/88  soft-tissue]
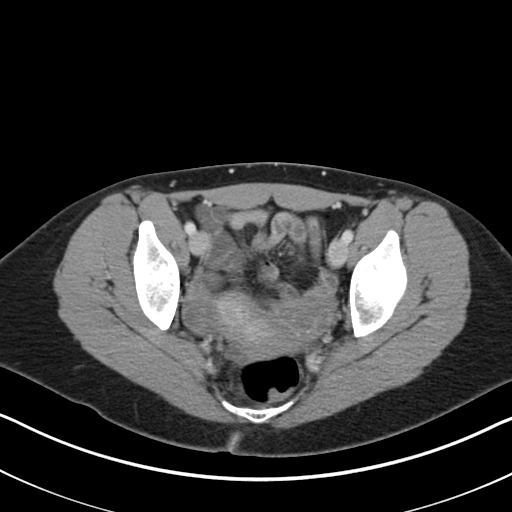
[im 33/88  soft-tissue]
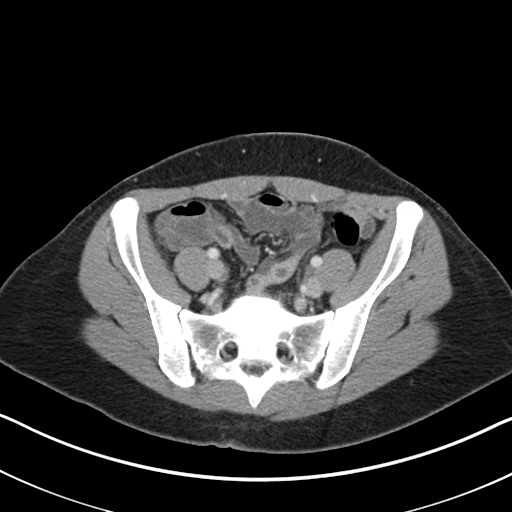
[im 37/88  soft-tissue]
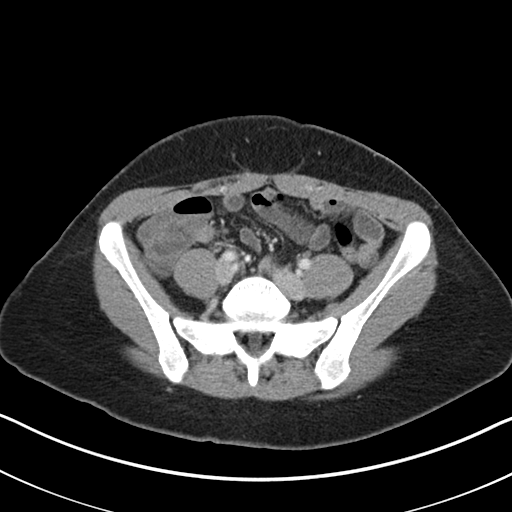
[im 46/88  soft-tissue]
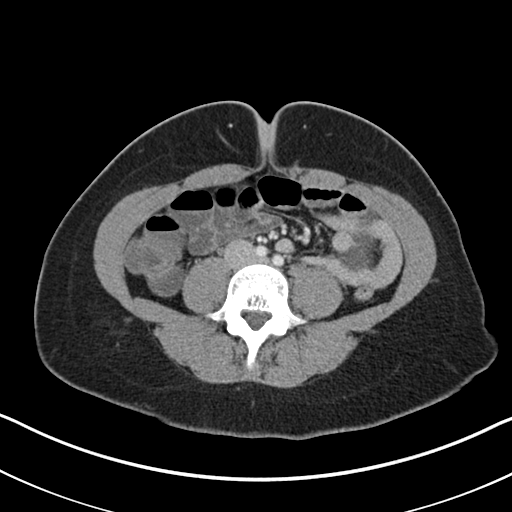
[im 51/88  soft-tissue]
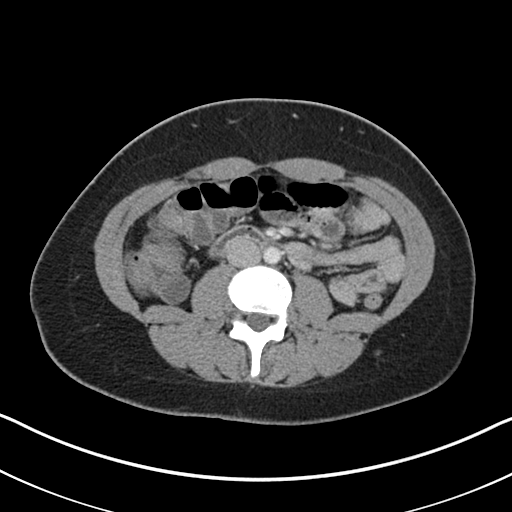
[im 55/88  soft-tissue]
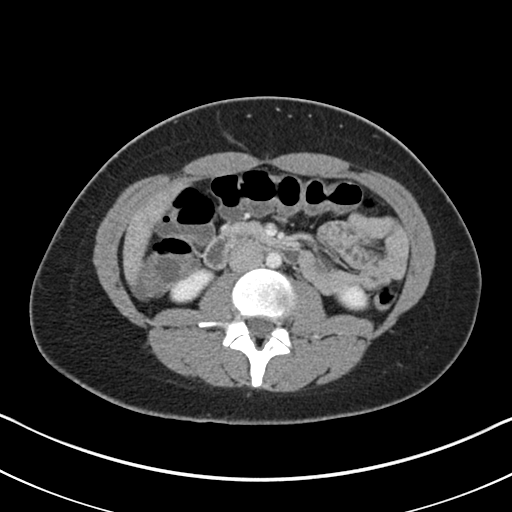
[im 55/88  bone]
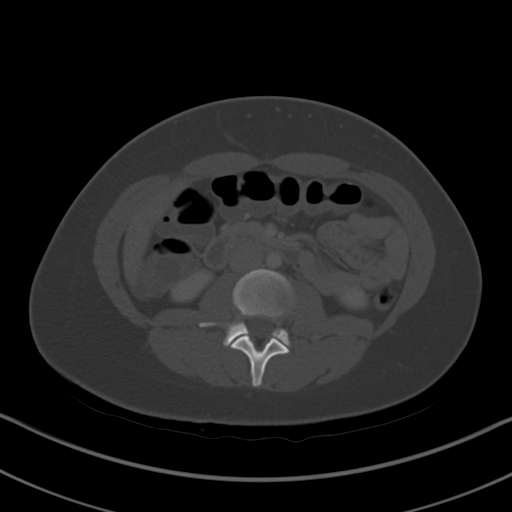
[im 65/88  soft-tissue]
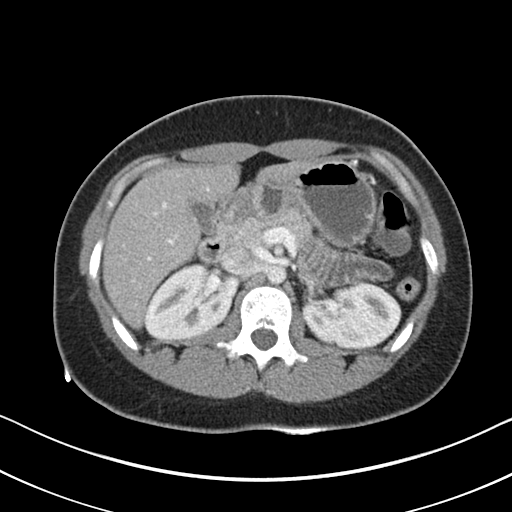
[im 69/88  soft-tissue]
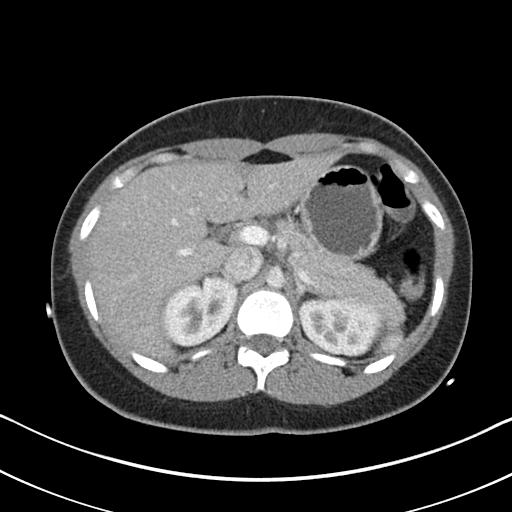
[im 74/88  soft-tissue]
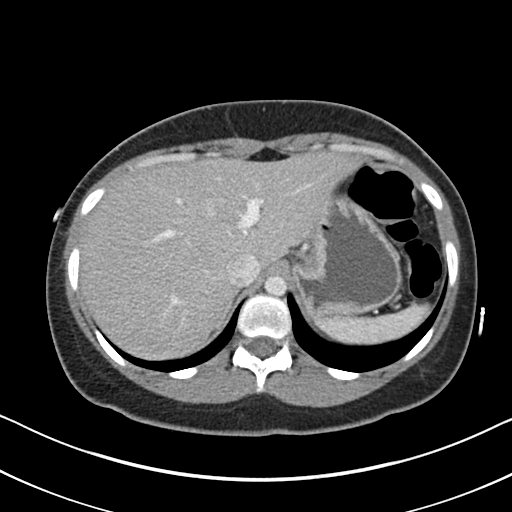
[im 83/88  soft-tissue]
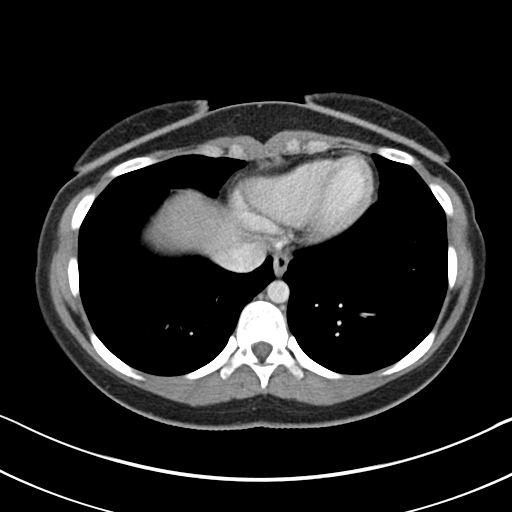

[Series 5: coronal st · coronal · 0.68mm/px · 3 of 140 slices shown]
[im 47/140  soft-tissue]
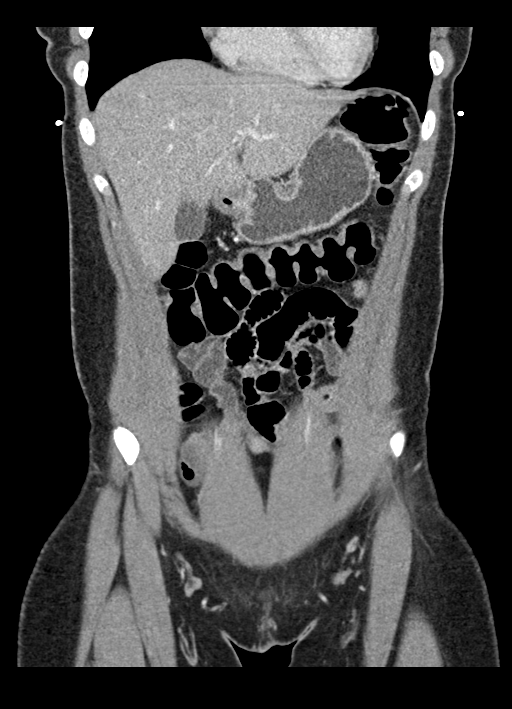
[im 62/140  soft-tissue]
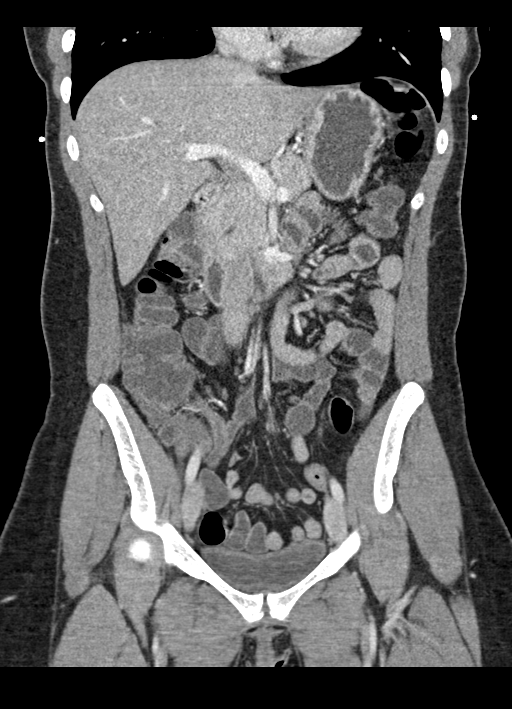
[im 78/140  soft-tissue]
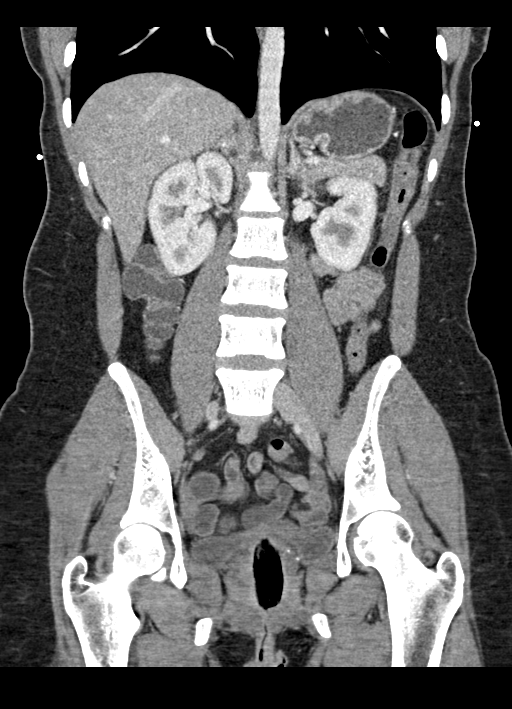

[16 of 46 positions shown; findings below may reference images not displayed]

FINDINGS: Lower chest: Lung bases are clear.

Hepatobiliary: Liver is within normal limits.

Gallbladder is unremarkable. No intrahepatic or extrahepatic ductal
dilatation.

Pancreas: Within normal limits.

Spleen: Within normal limits.

Adrenals/Urinary Tract: Adrenal glands are within normal limits.

Kidneys are within normal limits.  No hydronephrosis.

Bladder is within normal limits.

Stomach/Bowel: Stomach is within normal limits.

No evidence of bowel obstruction.

Normal appendix (coronal image 91).

No colonic wall thickening or inflammatory changes.

Vascular/Lymphatic: No evidence of abdominal aortic aneurysm.

No suspicious abdominopelvic lymphadenopathy.

Reproductive: Uterus is within normal limits.

Bilateral ovaries are within normal limits.

Other: No abdominopelvic ascites.

Musculoskeletal: Visualized osseous structures are within normal
limits.
IMPRESSION: Negative CT abdomen/pelvis.

## 2023-06-07 ENCOUNTER — Ambulatory Visit
Admission: EM | Admit: 2023-06-07 | Discharge: 2023-06-07 | Disposition: A | Discharge status: Home / Self Care | Payer: Self-pay | Attending: Family Medicine | Admitting: Family Medicine

## 2023-06-07 DIAGNOSIS — A084 Viral intestinal infection, unspecified: Secondary | ICD-10-CM

## 2023-06-07 MED ORDER — FAMOTIDINE 20 MG PO TABS: 20.0000 mg | ORAL_TABLET | Freq: Two times a day (BID) | ORAL | 0 refills | Status: AC

## 2023-06-07 MED ORDER — FAMOTIDINE 20 MG PO TABS: 20.0000 mg | ORAL_TABLET | Freq: Two times a day (BID) | ORAL | 0 refills | Status: DC

## 2023-06-07 MED ORDER — ONDANSETRON HCL 4 MG PO TABS: 4.0000 mg | ORAL_TABLET | Freq: Three times a day (TID) | ORAL | 0 refills | Status: DC | PRN

## 2023-06-07 MED ORDER — ONDANSETRON 4 MG PO TBDP
4.0000 mg | ORAL_TABLET | Freq: Once | ORAL | Status: AC
  Administered 2023-06-07: 4 mg via ORAL

## 2023-06-07 MED ORDER — ONDANSETRON HCL 4 MG PO TABS: 4.0000 mg | ORAL_TABLET | Freq: Three times a day (TID) | ORAL | 0 refills | Status: AC | PRN

## 2023-06-07 MED ORDER — ONDANSETRON HCL 4 MG/2ML IJ SOLN
2.0000 mg | INTRAMUSCULAR | Status: AC
  Administered 2023-06-07: 2 mg via INTRAMUSCULAR

## 2023-06-07 NOTE — ED Provider Notes (Signed)
 [name]    CSN: [id number] Arrival date & time: 06/07/23  1900      History   Chief Complaint Chief Complaint  Patient presents with   Abdominal Pain    HPI Colleen Bass is a 28 y.o. female.   Patient is here with acute onset of generalized abdominal pain and one episode of vomiting .  Patient thinks she may have gotten food poisoning as she ate a seafood sandwich last night prior to the onset of symptoms.  She has not had fever.  She has been able to tolerate some water. Patient denies urinary symptoms.  Past Medical History:  Diagnosis Date   Ovarian cyst     There are no active problems to display for this patient.   Past Surgical History:  Procedure Laterality Date   UPPER GI ENDOSCOPY  2017    OB History     Gravida  0   Para  0   Term  0   Preterm  0   AB  0   Living  0      SAB  0   IAB  0   Ectopic  0   Multiple  0   Live Births  0            Home Medications    Prior to Admission medications   Medication Sig Start Date End Date Taking? Authorizing Provider  famotidine (PEPCID) 20 MG tablet Take 1 tablet (20 mg total) by mouth 2 (two) times daily for 5 days. 06/07/23 06/12/23  Bing Neighbors, NP  ondansetron (ZOFRAN) 4 MG tablet Take 1 tablet (4 mg total) by mouth every 8 (eight) hours as needed for nausea or vomiting. 06/07/23   Bing Neighbors, NP    Family History Family History  Problem Relation Age of Onset   Thyroid disease Mother    Thyroid disease Sister    Diabetes Maternal Grandmother    Hypertension Maternal Aunt    Diabetes Maternal Aunt     Social History Social History   Tobacco Use   Smoking status: Some Days    Types: Cigars   Smokeless tobacco: Current  Substance Use Topics   Alcohol use: Yes    Comment: socially   Drug use: Yes    Types: Marijuana    Comment: socially     Allergies   Patient has no known allergies.   Review of Systems Review of Systems   Gastrointestinal:  Positive for abdominal pain.     Physical Exam Triage Vital Signs ED Triage Vitals  Encounter Vitals Group     BP 06/07/23 1942 126/80     Systolic BP Percentile --      Diastolic BP Percentile --      Pulse Rate 06/07/23 1942 77     Resp 06/07/23 1942 20     Temp 06/07/23 1942 98.8 F (37.1 C)     Temp Source 06/07/23 1942 Oral     SpO2 06/07/23 1942 96 %     Weight --      Height --      Head Circumference --      Peak Flow --      Pain Score 06/07/23 1940 9     Pain Loc --      Pain Education --      Exclude from Growth Chart --    No data found.  Updated Vital Signs BP 126/80   Pulse 77  Temp 98.8 F (37.1 C) (Oral)   Resp 20   LMP 05/25/2023 (Approximate)   SpO2 96%   Visual Acuity Right Eye Distance:   Left Eye Distance:   Bilateral Distance:    Right Eye Near:   Left Eye Near:    Bilateral Near:     Physical Exam Constitutional:      Appearance: She is well-developed.  HENT:     Head: Normocephalic and atraumatic.  Eyes:     Extraocular Movements: Extraocular movements intact.     Pupils: Pupils are equal, round, and reactive to light.  Cardiovascular:     Rate and Rhythm: Normal rate and regular rhythm.  Pulmonary:     Effort: Pulmonary effort is normal.     Breath sounds: Normal breath sounds.  Abdominal:     General: Abdomen is flat. Bowel sounds are normal. There is no distension or abdominal bruit.     Tenderness: There is no abdominal tenderness.  Skin:    General: Skin is warm and dry.  Neurological:     General: No focal deficit present.     Mental Status: She is alert.      UC Treatments / Results  Labs (all labs ordered are listed, but only abnormal results are displayed) Labs Reviewed - No data to display  EKG   Radiology No results found.  Procedures Procedures (including critical care time)  Medications Ordered in UC Medications  ondansetron (ZOFRAN-ODT) disintegrating tablet 4 mg (4 mg  Oral Given 06/07/23 1943)  ondansetron (ZOFRAN) injection 2 mg (2 mg Intramuscular Given 06/07/23 2017)    Initial Impression / Assessment and Plan / UC Course  I have reviewed the triage vital signs and the nursing notes.  Pertinent labs & imaging results that were available during my care of the patient were reviewed by me and considered in my medical decision making (see chart for details).    Viral Gastroenteritis, suspicion for acute abdomen.  The management warranted only as symptoms appear to be viral in etiology.  Famotidine to decrease bowel acid which is likely increasing abdominal discomfort and nausea. Zofran for nausea and vomiting.    ED precautions given.  Return if symptoms worsen or do not improve Final Clinical Impressions(s) / UC Diagnoses   Final diagnoses:  Viral gastroenteritis     Discharge Instructions      If any of your symptoms worsen or abdominal pain persist go immediately to the emergency department.  You for a viral GI illness medication as directed.  As discussed GI rest for 24 hours with fluids and bland foods only.  After 24 hours resume normal diety gradually.     ED Prescriptions     Medication Sig Dispense Auth. Provider   famotidine (PEPCID) 20 MG tablet  (Status: Discontinued) Take 1 tablet (20 mg total) by mouth 2 (two) times daily for 5 days. 10 tablet Bing Neighbors, NP   ondansetron (ZOFRAN) 4 MG tablet  (Status: Discontinued) Take 1 tablet (4 mg total) by mouth every 8 (eight) hours as needed for nausea or vomiting. 20 tablet Bing Neighbors, NP   famotidine (PEPCID) 20 MG tablet Take 1 tablet (20 mg total) by mouth 2 (two) times daily for 5 days. 10 tablet Bing Neighbors, NP   ondansetron (ZOFRAN) 4 MG tablet  (Status: Discontinued) Take 1 tablet (4 mg total) by mouth every 8 (eight) hours as needed for nausea or vomiting. 20 tablet Bing Neighbors, NP   ondansetron (  ZOFRAN) 4 MG tablet  (Status: Discontinued) Take 1 tablet (4  mg total) by mouth every 8 (eight) hours as needed for nausea or vomiting. 20 tablet Bing Neighbors, NP   ondansetron (ZOFRAN) 4 MG tablet  (Status: Discontinued) Take 1 tablet (4 mg total) by mouth every 8 (eight) hours as needed for nausea or vomiting. 20 tablet Bing Neighbors, NP   ondansetron (ZOFRAN) 4 MG tablet Take 1 tablet (4 mg total) by mouth every 8 (eight) hours as needed for nausea or vomiting. 20 tablet Bing Neighbors, NP      PDMP not reviewed this encounter.   Bing Neighbors, NP 06/12/23 [phone]

## 2023-06-07 NOTE — ED Triage Notes (Signed)
Pt states she woke up around 4am with abdominal pain and emesis. She thinks it is food poison. Pt ate seafood sandwiches approx 7 pm. Says she felt fine before eating sandwiches.

## 2023-06-07 NOTE — Discharge Instructions (Signed)
If any of your symptoms worsen or abdominal pain persist go immediately to the emergency department.  You for a viral GI illness medication as directed.  As discussed GI rest for 24 hours with fluids and bland foods only.  After 24 hours resume normal diety gradually.

## 2023-06-25 ENCOUNTER — Ambulatory Visit: Payer: BC Managed Care – PPO

## 2023-06-25 NOTE — Progress Notes (Deleted)
 Patient is in office today for a nurse visit for Immunization. Patient Injection was given in the  {Injection site:18846}. Patient tolerated injection well.

## 2024-01-17 ENCOUNTER — Ambulatory Visit (INDEPENDENT_AMBULATORY_CARE_PROVIDER_SITE_OTHER)

## 2024-01-17 DIAGNOSIS — Z23 Encounter for immunization: Secondary | ICD-10-CM | POA: Diagnosis not present

## 2024-01-31 ENCOUNTER — Ambulatory Visit: Payer: Self-pay | Admitting: Nurse Practitioner

## 2024-01-31 NOTE — Progress Notes (Deleted)
   Colleen Bass [birth year] [id number]   History:  28 y.o. G0 presents for annual exam. H/O irregular cycles, negative workup in the past. Was on COCs but did not feel it helped. Normal pap history. Has been struggling with weight gain despite consistent exercise since the new year. Doing combination of weight training and cardio. Feels she eats a healthy diet. Mother and sister with thyroid disease. Completed Gardasil 12/2023.  Gynecologic History No LMP recorded.   Contraception/Family planning: coitus interruptus Sexually active: Yes  Health Maintenance Last Pap: 12/18/2022. Results were: Normal Last mammogram: Not indicated Last colonoscopy: Not indicated Last Dexa: Not indicated      No data to display           Past medical history, past surgical history, family history and social history were all reviewed and documented in the EPIC chart. Boyfriend. 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  ROS:  A ROS was performed and pertinent positives and negatives are included.  Exam:  There were no vitals filed for this visit.   There is no height or weight on file to calculate BMI.  General appearance:  Normal Thyroid:  Symmetrical, normal in size, without palpable masses or nodularity. Respiratory  Auscultation:  Clear without wheezing or rhonchi Cardiovascular  Auscultation:  Regular rate, without rubs, murmurs or gallops  Edema/varicosities:  Not grossly evident Abdominal  Soft,nontender, without masses, guarding or rebound.  Liver/spleen:  No organomegaly noted  Hernia:  None appreciated  Skin  Inspection:  Grossly normal Breasts: Examined lying and sitting.   Right: Without masses, retractions, nipple discharge or axillary adenopathy.   Left: Without masses, retractions, nipple discharge or axillary adenopathy. Pelvic: External genitalia:  no lesions              Urethra:  normal appearing urethra with no masses, tenderness or lesions               Bartholins and Skenes: normal                 Vagina: normal appearing vagina with normal color and discharge, no lesions              Cervix: no lesions Bimanual Exam:  Uterus:  no masses or tenderness              Adnexa: no mass, fullness, tenderness              Rectovaginal: Deferred              Anus:  normal, no lesions   Assessment/Plan:  28 y.o. G0 for annual exam.   Well female exam with routine gynecological exam - Plan: CBC with Differential/Platelet, Comprehensive metabolic panel. Education provided on SBEs, importance of preventative screenings, current guidelines, high calcium diet, regular exercise, and multivitamin daily.   Screening for cervical cancer - Normal pap history. Will repeat at 3-year interval per guidelines.   Family history of thyroid disease - Plan: TSH. Mother and sister.   Need for HPV vaccination - Plan: HPV 9-valent vaccine,Recombinat  No follow-ups on file.     Colleen DELENA Shutter DNP, 11:04 AM 01/31/2024
